# Patient Record
Sex: Male | Born: 1980 | Race: White | Hispanic: No | Marital: Single | Smoking: Former smoker
Health system: Southern US, Community
[De-identification: ages and names within clinical notes are randomized; demographics above are authoritative.]

## PROBLEM LIST (undated history)

## (undated) DIAGNOSIS — Z8619 Personal history of other infectious and parasitic diseases: Secondary | ICD-10-CM

## (undated) DIAGNOSIS — F419 Anxiety disorder, unspecified: Secondary | ICD-10-CM

## (undated) DIAGNOSIS — K219 Gastro-esophageal reflux disease without esophagitis: Secondary | ICD-10-CM

## (undated) HISTORY — PX: CLOSED REDUCTION RADIAL HEAD / NECK FRACTURE: SUR238

## (undated) HISTORY — PX: TONSILLECTOMY: SUR1361

## (undated) HISTORY — PX: WISDOM TOOTH EXTRACTION: SHX21

## (undated) HISTORY — DX: Anxiety disorder, unspecified: F41.9

## (undated) HISTORY — DX: Gastro-esophageal reflux disease without esophagitis: K21.9

## (undated) HISTORY — DX: Personal history of other infectious and parasitic diseases: Z86.19

---
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
## 2004-01-28 ENCOUNTER — Ambulatory Visit: Payer: Self-pay | Admitting: Internal Medicine

## 2004-03-12 ENCOUNTER — Ambulatory Visit: Payer: Self-pay | Admitting: Internal Medicine

## 2004-06-18 ENCOUNTER — Ambulatory Visit: Payer: Self-pay | Admitting: Internal Medicine

## 2004-09-05 ENCOUNTER — Ambulatory Visit: Payer: Self-pay | Admitting: Internal Medicine

## 2004-11-18 ENCOUNTER — Ambulatory Visit: Payer: Self-pay | Admitting: Internal Medicine

## 2005-04-02 ENCOUNTER — Ambulatory Visit: Payer: Self-pay | Admitting: Internal Medicine

## 2005-09-22 ENCOUNTER — Ambulatory Visit: Payer: Self-pay | Admitting: Internal Medicine

## 2006-02-09 HISTORY — PX: OPEN REDUCTION INTERNAL FIXATION (ORIF) TIBIA/FIBULA FRACTURE: SHX5992

## 2006-04-06 ENCOUNTER — Ambulatory Visit (HOSPITAL_BASED_OUTPATIENT_CLINIC_OR_DEPARTMENT_OTHER): Admission: RE | Admit: 2006-04-06 | Discharge: 2006-04-06 | Payer: Self-pay | Admitting: Internal Medicine

## 2006-04-15 ENCOUNTER — Ambulatory Visit: Payer: Self-pay | Admitting: Pulmonary Disease

## 2006-05-11 ENCOUNTER — Ambulatory Visit: Payer: Self-pay | Admitting: Internal Medicine

## 2006-05-11 LAB — CONVERTED CEMR LAB
Calcium: 9.3 mg/dL (ref 8.4–10.5)
Ferritin: 104.9 ng/mL (ref 22.0–322.0)
Folate: 11.7 ng/mL
Glucose, Bld: 82 mg/dL (ref 70–99)
Magnesium: 2.2 mg/dL (ref 1.5–2.5)
Vitamin B-12: 487 pg/mL (ref 211–911)

## 2006-06-30 ENCOUNTER — Encounter: Payer: Self-pay | Admitting: Internal Medicine

## 2006-06-30 ENCOUNTER — Ambulatory Visit: Payer: Self-pay | Admitting: Internal Medicine

## 2006-07-23 ENCOUNTER — Ambulatory Visit: Payer: Self-pay | Admitting: Internal Medicine

## 2006-12-08 ENCOUNTER — Telehealth (INDEPENDENT_AMBULATORY_CARE_PROVIDER_SITE_OTHER): Payer: Self-pay | Admitting: *Deleted

## 2006-12-09 ENCOUNTER — Ambulatory Visit: Payer: Self-pay | Admitting: Internal Medicine

## 2006-12-09 DIAGNOSIS — F411 Generalized anxiety disorder: Secondary | ICD-10-CM | POA: Insufficient documentation

## 2007-01-24 ENCOUNTER — Encounter: Payer: Self-pay | Admitting: Internal Medicine

## 2007-01-28 ENCOUNTER — Ambulatory Visit: Payer: Self-pay | Admitting: Internal Medicine

## 2007-05-06 ENCOUNTER — Telehealth (INDEPENDENT_AMBULATORY_CARE_PROVIDER_SITE_OTHER): Payer: Self-pay | Admitting: *Deleted

## 2007-07-20 ENCOUNTER — Telehealth (INDEPENDENT_AMBULATORY_CARE_PROVIDER_SITE_OTHER): Payer: Self-pay | Admitting: *Deleted

## 2007-11-18 ENCOUNTER — Encounter (INDEPENDENT_AMBULATORY_CARE_PROVIDER_SITE_OTHER): Payer: Self-pay | Admitting: *Deleted

## 2008-07-16 ENCOUNTER — Ambulatory Visit: Payer: Self-pay | Admitting: Internal Medicine

## 2008-07-16 DIAGNOSIS — K625 Hemorrhage of anus and rectum: Secondary | ICD-10-CM | POA: Insufficient documentation

## 2008-07-16 DIAGNOSIS — R0989 Other specified symptoms and signs involving the circulatory and respiratory systems: Secondary | ICD-10-CM

## 2008-07-16 DIAGNOSIS — I479 Paroxysmal tachycardia, unspecified: Secondary | ICD-10-CM

## 2008-07-16 DIAGNOSIS — R0609 Other forms of dyspnea: Secondary | ICD-10-CM

## 2008-07-16 DIAGNOSIS — K219 Gastro-esophageal reflux disease without esophagitis: Secondary | ICD-10-CM | POA: Insufficient documentation

## 2008-08-27 ENCOUNTER — Telehealth: Payer: Self-pay | Admitting: Internal Medicine

## 2009-08-13 ENCOUNTER — Ambulatory Visit: Payer: Self-pay | Admitting: Internal Medicine

## 2009-08-13 DIAGNOSIS — M79609 Pain in unspecified limb: Secondary | ICD-10-CM

## 2009-10-30 ENCOUNTER — Encounter: Payer: Self-pay | Admitting: Internal Medicine

## 2010-03-09 LAB — CONVERTED CEMR LAB
Basophils Relative: 0.2 % (ref 0.0–3.0)
Chloride: 103 meq/L (ref 96–112)
Creatinine, Ser: 0.8 mg/dL (ref 0.4–1.5)
Eosinophils Relative: 0.5 % (ref 0.0–5.0)
Free T4: 0.7 ng/dL (ref 0.6–1.6)
Hemoglobin: 15.6 g/dL (ref 13.0–17.0)
Lymphocytes Relative: 31.6 % (ref 12.0–46.0)
MCV: 95.3 fL (ref 78.0–100.0)
Magnesium: 2.3 mg/dL (ref 1.5–2.5)
Monocytes Absolute: 0.5 10*3/uL (ref 0.1–1.0)
Neutro Abs: 3.9 10*3/uL (ref 1.4–7.7)
Neutrophils Relative %: 59.8 % (ref 43.0–77.0)
Potassium: 3.9 meq/L (ref 3.5–5.1)
RBC: 4.66 M/uL (ref 4.22–5.81)
Sodium: 140 meq/L (ref 135–145)
WBC: 6.4 10*3/uL (ref 4.5–10.5)

## 2010-03-13 NOTE — Assessment & Plan Note (Signed)
Summary: left wrist pain//kn-Rm 2   Vital Signs:  Patient profile:   30 year old male Height:      65.75 inches Weight:      167.25 pounds BMI:     27.30 Temp:     98.7 degrees F oral Pulse rate:   60 / minute Pulse rhythm:   regular Resp:     12 per minute BP sitting:   128 / 70  (right arm) Cuff size:   regular  Vitals Entered By: Mervin Kung CMA Duncan Dull) (August 13, 2009 1:42 PM) CC: Room 2   Pt states he has left wrist pain x 2 weeks., Lower Extremity Joint pain Is Patient Diabetic? No Comments Pt states he does not need Klonopin any longer and hasn't taken x 6 months.   CC:  Room 2   Pt states he has left wrist pain x 2 weeks. and Lower Extremity Joint pain.  History of Present Illness:  Upper  Extremity Joint Pain      This is a 30 year old man who presents with Upper  Extremity Joint pain X 2 weeks @ ulnar aspect  wrist.  The patient reports weakness, but denies swelling, redness, giving away, locking, popping, stiffness for >1 hr, and decreased ROM.  The pain is located in the left wrist.  The pain began gradually and with no injury.  The pain is described as aching and constant.  There has been no evaluation to date   The patient denies the following symptoms: fever, rash, photosensitivity, eye symptoms, diarrhea, and dysuria.  Rx: NSAIDS with minimal , temporary benefit. A brace helps.Pain worse with palmar weight bearing.  Allergies: 1)  ! Pcn 2)  ! * Grass 3)  ! * Pollen  Review of Systems General:  Denies chills, sweats, and weight loss. Eyes:  Denies discharge, eye pain, and red eye. GU:  Denies discharge and hematuria. Derm:  Denies lesion(s) and rash.  Physical Exam  Cervical Nodes:  No lymphadenopathy noted Axillary Nodes:  No palpable lymphadenopathy   Wrist/Hand Exam  General:    Well-developed, well-nourished, in no acute distress; alert and oriented x 3.    Skin:    Intact with no erythema; no scarring.    Inspection:    Inspection is  normal.    Palpation:    Non-tender to palpation   Vascular:    Radial, ulnar pulses 2+ and symmetric; no evidence of ischemia, clubbing, or cyanosis.    Wrist Exam:    Left:    Inspection:  Normal    Palpation:  Normal    Stability:  stable    Tenderness:  no    Swelling:  no    Erythema:  no    Full ROM   Impression & Recommendations:  Problem # 1:  HAND PAIN, LEFT (ICD-729.5)  Orders: Misc. Referral (Misc. Ref)  Complete Medication List: 1)  Prilosec 20 Mg Cpdr (Omeprazole) .... Prn 2)  Propecia 1 Mg Tabs (Finasteride) .Marland Kitchen.. 1 by mouth qd 3)  Multivitamin  .... Liquid vitamin double strength qd 4)  Klonopin 0.5 Mg Tabs (Clonazepam) .Marland Kitchen.. 1 at bedtime prn 5)  Sertraline Hcl 50 Mg Tabs (Sertraline hcl) .Marland Kitchen.. 1 once daily  Patient Instructions: 1)  Use brace. Glucosamine Sulfate 1500 mg once daily until seen by Hand Surgeon  Current Allergies (reviewed today): ! PCN ! * GRASS ! * POLLEN

## 2010-03-13 NOTE — Medication Information (Signed)
Summary: Nonadherence with Sertraline/[facility]  Nonadherence with Sertraline/[facility]   Imported By: Lanelle Bal 11/07/2009 10:06:53  _____________________________________________________________________  External Attachment:    Type:   Image     Comment:   External Document

## 2010-06-27 NOTE — Assessment & Plan Note (Signed)
[facility]                        [place] OFFICE NOTE   CORRADO, HYMON                         MRN:          [id number]  DATE:05/11/2006                            DOB:          1980/11/05    Brett Wright was seen May 11, 2006 to review a sleep study performed  April 06, 2006 which suggested small numbers of obstructive events,  which did not meet apnea/hypopnea index criteria for obstructive sleep  apnea.  Moderate numbers of leg jerks were noted with at least some  degree of sleep disruption.   He states he has had minor restless leg syndrome symptoms in the past,  but not consistently.  His main symptoms consist of tightness in his  quadriceps and his knees, and aching in the legs after he exercises.  He  exercises at a high cardiovascular rate or level, such as running 3  miles in 30 minutes 3 to 4 times a week.  He denies any claudication  symptoms.   He feels his problem is more of a sleep dysfunction as he will lie  sleepless in bed for hours.  He is practicing sleep hygiene, avoiding  stimulants and alcohol at night.  He notices, when he does drink on the  weekends, that his sleep pattern is altered adversely.   Deep tendon reflexes are normal and pulses are intact.  Homan sign is  negative.  Skin is normal in temperature and color over the lower  extremities.   He will continue sleep hygiene maneuvers as he is presently doing.   To evaluate the possibility of restless leg syndrome, B12, folate,  calcium, magnesium, glucose, BUN, creatinine, potassium, and magnesium  levels, as well as ferritin will be drawn.  Additionally a CPK & vit  D(25 OH) will be added because of the aching he has in his thighs.  Clonazepam 0.5 mg at bedtime as needed will be provided as a trial, due  to the description he gives of this sleep dysfunction.     Titus Dubin. Alwyn Ren, MD,FACP,FCCP  Electronically Signed    [facility]  DD: 05/11/2006   DT: 05/11/2006  Job #: [id number]

## 2010-06-27 NOTE — Procedures (Signed)
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   EPWORTH SLEEPINESS SCORE:  6   MEDICATIONS:   SLEEP ARCHITECTURE:  The patient had a total sleep time of 299 minutes  with very decreased slow wave sleep and REM.  Sleep onset latency was  prolonged at 67 minutes and REM onset prolonged at 270 minutes.  Sleep  efficiency was very poor at 62%.   RESPIRATORY DATA:  The patient was found to have two obstructive apneas  for an apnea hypopnea index less than one per hour.  Obviously, he did  not meet split night protocol secondary to the very small numbers of  events.  There was mild intermittent snoring noted.   OXYGEN DATA:  His O2 saturation was as low as 91%.   CARDIAC DATA:  No clinically significant arrhythmias were noted.   MOVEMENT-PARASOMNIA:  The patient was found to have 78 leg jerks with  3.6 per hour resulting in arousal or awakening.  This may be clinically  significant.   IMPRESSIONS-RECOMMENDATIONS:  1. Small numbers of obstructive events which do not meet the apnea-      hypopnea index criteria for the obstructive sleep apnea syndrome.  2. Moderate numbers of leg jerks with at least some degree of sleep      disruption.  Clinical correlation is suggested      to see if the patient may have a history consistent with the      restless leg syndrome or the periodic leg movement syndrome.      Barbaraann Share, MD,FCCP  Diplomate, American Board of Sleep  Medicine  Electronically Signed     [facility]  D:  04/22/2006 17:40:56  T:  04/24/2006 09:24:19  Job:   [id number]

## 2010-12-10 ENCOUNTER — Encounter: Payer: Self-pay | Admitting: Internal Medicine

## 2010-12-10 ENCOUNTER — Ambulatory Visit (INDEPENDENT_AMBULATORY_CARE_PROVIDER_SITE_OTHER): Payer: BC Managed Care – PPO | Admitting: Internal Medicine

## 2010-12-10 VITALS — BP 124/84 | HR 75 | Temp 98.9°F | Resp 14 | Wt 164.4 lb

## 2010-12-10 DIAGNOSIS — R21 Rash and other nonspecific skin eruption: Secondary | ICD-10-CM

## 2010-12-10 DIAGNOSIS — L309 Dermatitis, unspecified: Secondary | ICD-10-CM

## 2010-12-10 MED ORDER — FAMCICLOVIR 500 MG PO TABS: 500.0000 mg | ORAL_TABLET | Freq: Three times a day (TID) | ORAL | Status: AC

## 2010-12-10 NOTE — Progress Notes (Signed)
  Subjective:    Patient ID: Brett Wright, male    DOB: October 17, 1980, 30 y.o.   MRN: [id number]  HPI RASH: Location: R inguinal area  Onset: 1 week ago but similar rash in 2006 which resolved w/o Rx . Initially there were multiple papules in the right lower quadrant/inguinal area.  Course: The papules resolved but 2  residual lesions with scabbing remain Self-treated with: cleansing, J&J baby powder              Improvement with treatment: keeps it dry History Pruritis: no  Tenderness: no   Tick/insect/pet exposure: no  Recent travel: no  New detergent, new clothing, or other topical exposure: no   Red Flags Feeling ill: no  Fever: no  Mouth lesions: no  Facial/tongue swelling/difficulty breathing:  no,      Review of Systems he denies dysuria, hematuria, or pyuria.  He has been under increased stress recently.     Objective:   Physical Exam  He is healthy appearing in no acute distress.  He has no lymphadenopathy in the neck, axilla, or inguinal areas. He has no organomegaly or masses.  There are 2 areas of eschar with mild surrounding erythema which are nontender and the right inguinal area. The lesion measures 6 x 5 mm each  Genital exam is unremarkable; there are no penile lesions        Assessment & Plan:  #1 a herpetic infection, most likely zoster is suspect. It is possible this represents a variant of herpes simplex.  Plan: See orders and recommendations.

## 2010-12-10 NOTE — Patient Instructions (Signed)
If immune compromise suggested take  vitamin C 2000 mg daily; & Echinacea for 7-10 days. Report fever, exudate("pus") or progressive pain.

## 2011-04-08 ENCOUNTER — Other Ambulatory Visit: Payer: Self-pay | Admitting: Internal Medicine

## 2011-04-08 NOTE — Telephone Encounter (Signed)
Prescription sent

## 2011-10-14 ENCOUNTER — Telehealth: Payer: Self-pay | Admitting: Internal Medicine

## 2011-10-14 MED ORDER — SERTRALINE HCL 50 MG PO TABS: ORAL_TABLET | ORAL | Status: DC

## 2011-10-14 NOTE — Telephone Encounter (Signed)
RX sent in, patient due for CPX 11/2011 or after

## 2011-10-14 NOTE — Telephone Encounter (Signed)
Refill: Zoloft 50mg . Take 1 tablet daily. Qty 30. Last fill 06-15-11

## 2012-09-08 ENCOUNTER — Ambulatory Visit (INDEPENDENT_AMBULATORY_CARE_PROVIDER_SITE_OTHER): Payer: BC Managed Care – PPO | Admitting: Internal Medicine

## 2012-09-08 ENCOUNTER — Encounter: Payer: Self-pay | Admitting: Internal Medicine

## 2012-09-08 VITALS — BP 122/80 | HR 62 | Temp 98.2°F | Wt 161.6 lb

## 2012-09-08 DIAGNOSIS — F411 Generalized anxiety disorder: Secondary | ICD-10-CM

## 2012-09-08 MED ORDER — SERTRALINE HCL 50 MG PO TABS: ORAL_TABLET | ORAL | Status: DC

## 2012-09-08 NOTE — Progress Notes (Signed)
  Subjective:    Patient ID: Brett Wright, male    DOB: Aug 21, 1980, 32 y.o.   MRN: [id number]  HPI   He had taken generic Zoloft for approximately 2.5 years; he discontinued it at least 6 months ago to see if it were of benefit.  He does describe symptoms of serotonin deficiency such as anxiety, irritability, and difficulty in difficulty managing  multiple tasks. This affects sleep. There is some anhedonia.  His work is very stressful with multiple demands. He now has a 48-year-old.  He denies a constellation of headache, flushing, chest pain, and diarrhea.    Review of Systems  Constitutional: No significant change in weight,  fatigue, weakness Cardiovascular: No palpitations, racing, irregularity, sweating Gastrointestinal: No nausea or vomiting,constipation, diarrhea Neuro: No tremor, mental status change,headache  Psych: No  depression,  panic attacks,alcohhol or drug abuse Endocrine: No change in skin/hair/nails       Objective:   Physical Exam Appears healthy and well-nourished & in no acute distress  No carotid bruits are present. Thyroid normal to palpation  Heart rhythm and rate are normal with no significant murmurs or gallops.  Chest is clear with no increased work of breathing  There is no evidence of aortic aneurysm or renal artery bruits  Abdomen soft with no organomegaly or masses. No HJR  No clubbing, cyanosis or edema present. No onycholysis  Pedal pulses are intact   No ischemic skin changes are present . Nails healthy    Alert and oriented. Strength, tone, DTRs reflexes normal. No tremor          Assessment & Plan:  See Current Assessment & Plan in Problem List under specific Diagnosis

## 2012-09-08 NOTE — Assessment & Plan Note (Signed)
TSH, free T4 Resume Sertraline

## 2012-09-08 NOTE — Addendum Note (Signed)
Addended byPecola Lawless on: 09/08/2012 08:52 AM   Modules accepted: Orders

## 2012-09-08 NOTE — Patient Instructions (Addendum)

## 2015-02-15 ENCOUNTER — Encounter: Payer: Self-pay | Admitting: Physician Assistant

## 2015-02-15 ENCOUNTER — Ambulatory Visit (INDEPENDENT_AMBULATORY_CARE_PROVIDER_SITE_OTHER): Payer: BLUE CROSS/BLUE SHIELD | Admitting: Physician Assistant

## 2015-02-15 VITALS — BP 118/86 | HR 84 | Temp 98.1°F | Resp 16 | Ht 66.0 in | Wt 172.2 lb

## 2015-02-15 DIAGNOSIS — Z23 Encounter for immunization: Secondary | ICD-10-CM

## 2015-02-15 DIAGNOSIS — Z0000 Encounter for general adult medical examination without abnormal findings: Secondary | ICD-10-CM | POA: Diagnosis not present

## 2015-02-15 NOTE — Progress Notes (Signed)
Pre visit review using our clinic review tool, if applicable. No additional management support is needed unless otherwise documented below in the visit note/SLS  

## 2015-02-15 NOTE — Progress Notes (Signed)
Patient presents to clinic today to transfer of care from Dr. Alwyn Ren. Patient is requesting a checkup today. Denies acute concerns. Endorses 20 pound weight gain the past 2 years and is curious about cholesterol. Body mass index is 27.82 kg/(m^2). Patient endorses diet well-balanced. Does endorse sedentary lifestyle due to his job.  Has restarted a daily workout regimen and already noticed weight loss. Patient takes a multivitamin daily.  Past Medical History  Diagnosis Date  . Anxiety     no current issue -- many years ago  . GERD (gastroesophageal reflux disease)   . History of chicken pox     Current Outpatient Prescriptions on File Prior to Visit  Medication Sig Dispense Refill  . clonazePAM (KLONOPIN) 0.5 MG tablet Take 0.5 mg by mouth at bedtime as needed.      . multivitamin (THERAGRAN) per tablet Take 1 tablet by mouth daily.       No current facility-administered medications on file prior to visit.    Allergies  Allergen Reactions  . Pollen Extract     Allergic rhinoconjunctivitis  . Penicillins      ? Reaction as child    Family History  Problem Relation Age of Onset  . Hypertension Father     Lving  . Anxiety disorder Father     stress  . Thyroid disease Mother     Living  . Heart attack Maternal Grandmother     in 51s  . Thyroid disease Maternal Grandmother   . Lung cancer Paternal Uncle     SMOKER  . Diabetes Neg Hx   . Stroke Neg Hx   . Heart disease Other     Paternal Side  . Heart disease Father   . Healthy Son     x2    Social History   Social History  . Marital Status: Single    Spouse Name: N/A  . Number of Children: N/A  . Years of Education: N/A   Occupational History  . financial advisor    Social History Main Topics  . Smoking status: Former Smoker    Quit date: 02/10/1999  . Smokeless tobacco: Never Used     Comment: smoked 1996-2001 , 1 pp MONTH  . Alcohol Use: 0.0 oz/week    0 Standard drinks or equivalent per week   Comment: SOCIALLY  . Drug Use: No  . Sexual Activity:    Partners: Female     Comment: wife   Other Topics Concern  . None   Social History Narrative   Regular Exercise-3x wkly         Review of Systems  Constitutional: Negative for fever and weight loss.  HENT: Negative for ear discharge, ear pain, hearing loss and tinnitus.   Eyes: Negative for blurred vision, double vision, photophobia and pain.  Respiratory: Negative for cough and shortness of breath.   Cardiovascular: Negative for chest pain and palpitations.  Gastrointestinal: Negative for heartburn, nausea, vomiting, abdominal pain, diarrhea, constipation, blood in stool and melena.  Genitourinary: Negative for dysuria, urgency, frequency, hematuria and flank pain.  Musculoskeletal: Negative for falls.  Neurological: Negative for dizziness, loss of consciousness and headaches.  Endo/Heme/Allergies: Negative for environmental allergies.  Psychiatric/Behavioral: Negative for depression, suicidal ideas, hallucinations and substance abuse. The patient is not nervous/anxious and does not have insomnia.    BP 118/86 mmHg  Pulse 84  Temp(Src) 98.1 F (36.7 C) (Oral)  Resp 16  Ht 5\' 6"  (1.676 m)  Wt 172 lb 4  oz (78.132 kg)  BMI 27.82 kg/m2  SpO2 98%  Physical Exam  Constitutional: He is oriented to person, place, and time and well-developed, well-nourished, and in no distress.  HENT:  Head: Normocephalic and atraumatic.  Right Ear: External ear normal.  Left Ear: External ear normal.  Nose: Nose normal.  Mouth/Throat: Oropharynx is clear and moist. No oropharyngeal exudate.  Eyes: Conjunctivae and EOM are normal. Pupils are equal, round, and reactive to light.  Neck: Neck supple. No thyromegaly present.  Cardiovascular: Normal rate, regular rhythm, normal heart sounds and intact distal pulses.   Pulmonary/Chest: Effort normal and breath sounds normal. No respiratory distress. He has no wheezes. He has no rales. He  exhibits no tenderness.  Abdominal: Soft. Bowel sounds are normal. He exhibits no distension and no mass. There is no tenderness. There is no rebound and no guarding.  Genitourinary: Testes/scrotum normal.  Not examined today  Lymphadenopathy:    He has no cervical adenopathy.  Neurological: He is alert and oriented to person, place, and time.  Skin: Skin is warm and dry. No rash noted.  Psychiatric: Affect normal.  Vitals reviewed.   Assessment/Plan: Visit for preventive health examination Depression screen negative. Health Maintenance reviewed -- Declines flu shot. TDaP given today. Preventive schedule discussed and handout given in AVS. Will obtain fasting labs today.

## 2015-02-15 NOTE — Patient Instructions (Signed)
Please go to the lab for blood work.  I will call you with your results. If your blood work is normal we will follow-up yearly for physicals.  If anything is abnormal we will treat you and get you in for a follow-up.  Please continue your diet and exercise regimen to promote healthy weight.  Do not hesitate to call or come see [place] in office for any acute concerns or questions you may have.  Preventive Care for Adults, Male A healthy lifestyle and preventive care can promote health and wellness. Preventive health guidelines for men include the following key practices:  A routine yearly physical is a good way to check with your health care provider about your health and preventative screening. It is a chance to share any concerns and updates on your health and to receive a thorough exam.  Visit your dentist for a routine exam and preventative care every 6 months. Brush your teeth twice a day and floss once a day. Good oral hygiene prevents tooth decay and gum disease.  The frequency of eye exams is based on your age, health, family medical history, use of contact lenses, and other factors. Follow your health care provider's recommendations for frequency of eye exams.  Eat a healthy diet. Foods such as vegetables, fruits, whole grains, low-fat dairy products, and lean protein foods contain the nutrients you need without too many calories. Decrease your intake of foods high in solid fats, added sugars, and salt. Eat the right amount of calories for you.Get information about a proper diet from your health care provider, if necessary.  Regular physical exercise is one of the most important things you can do for your health. Most adults should get at least 150 minutes of moderate-intensity exercise (any activity that increases your heart rate and causes you to sweat) each week. In addition, most adults need muscle-strengthening exercises on 2 or more days a week.  Maintain a healthy weight. The body  mass index (BMI) is a screening tool to identify possible weight problems. It provides an estimate of body fat based on height and weight. Your health care provider can find your BMI and can help you achieve or maintain a healthy weight.For adults 20 years and older:  A BMI below 18.5 is considered underweight.  A BMI of 18.5 to 24.9 is normal.  A BMI of 25 to 29.9 is considered overweight.  A BMI of 30 and above is considered obese.  Maintain normal blood lipids and cholesterol levels by exercising and minimizing your intake of saturated fat. Eat a balanced diet with plenty of fruit and vegetables. Blood tests for lipids and cholesterol should begin at age 52 and be repeated every 5 years. If your lipid or cholesterol levels are high, you are over 50, or you are at high risk for heart disease, you may need your cholesterol levels checked more frequently.Ongoing high lipid and cholesterol levels should be treated with medicines if diet and exercise are not working.  If you smoke, find out from your health care provider how to quit. If you do not use tobacco, do not start.  Lung cancer screening is recommended for adults aged 76-80 years who are at high risk for developing lung cancer because of a history of smoking. A yearly low-dose CT scan of the lungs is recommended for people who have at least a 30-pack-year history of smoking and are a current smoker or have quit within the past 15 years. A pack year of smoking  is smoking an average of 1 pack of cigarettes a day for 1 year (for example: 1 pack a day for 30 years or 2 packs a day for 15 years). Yearly screening should continue until the smoker has stopped smoking for at least 15 years. Yearly screening should be stopped for people who develop a health problem that would prevent them from having lung cancer treatment.  If you choose to drink alcohol, do not have more than 2 drinks per day. One drink is considered to be 12 ounces (355 mL) of  beer, 5 ounces (148 mL) of wine, or 1.5 ounces (44 mL) of liquor.  Avoid use of street drugs. Do not share needles with anyone. Ask for help if you need support or instructions about stopping the use of drugs.  High blood pressure causes heart disease and increases the risk of stroke. Your blood pressure should be checked at least every 1-2 years. Ongoing high blood pressure should be treated with medicines, if weight loss and exercise are not effective.  If you are 97-41 years old, ask your health care provider if you should take aspirin to prevent heart disease.  Diabetes screening is done by taking a blood sample to check your blood glucose level after you have not eaten for a certain period of time (fasting). If you are not overweight and you do not have risk factors for diabetes, you should be screened once every 3 years starting at age 85. If you are overweight or obese and you are 17-43 years of age, you should be screened for diabetes every year as part of your cardiovascular risk assessment.  Colorectal cancer can be detected and often prevented. Most routine colorectal cancer screening begins at the age of 81 and continues through age 56. However, your health care provider may recommend screening at an earlier age if you have risk factors for colon cancer. On a yearly basis, your health care provider may provide home test kits to check for hidden blood in the stool. Use of a small camera at the end of a tube to directly examine the colon (sigmoidoscopy or colonoscopy) can detect the earliest forms of colorectal cancer. Talk to your health care provider about this at age 33, when routine screening begins. Direct exam of the colon should be repeated every 5-10 years through age 58, unless early forms of precancerous polyps or small growths are found.  People who are at an increased risk for hepatitis B should be screened for this virus. You are considered at high risk for hepatitis B if:  You  were born in a country where hepatitis B occurs often. Talk with your health care provider about which countries are considered high risk.  Your parents were born in a high-risk country and you have not received a shot to protect against hepatitis B (hepatitis B vaccine).  You have HIV or AIDS.  You use needles to inject street drugs.  You live with, or have sex with, someone who has hepatitis B.  You are a man who has sex with other men (MSM).  You get hemodialysis treatment.  You take certain medicines for conditions such as cancer, organ transplantation, and autoimmune conditions.  Hepatitis C blood testing is recommended for all people born from [birth year] through 1965 and any individual with known risks for hepatitis C.  Practice safe sex. Use condoms and avoid high-risk sexual practices to reduce the spread of sexually transmitted infections (STIs). STIs include gonorrhea, chlamydia, syphilis, trichomonas, herpes,  HPV, and human immunodeficiency virus (HIV). Herpes, HIV, and HPV are viral illnesses that have no cure. They can result in disability, cancer, and death.  If you are a man who has sex with other men, you should be screened at least once per year for:  HIV.  Urethral, rectal, and pharyngeal infection of gonorrhea, chlamydia, or both.  If you are at risk of being infected with HIV, it is recommended that you take a prescription medicine daily to prevent HIV infection. This is called preexposure prophylaxis (PrEP). You are considered at risk if:  You are a man who has sex with other men (MSM) and have other risk factors.  You are a heterosexual man, are sexually active, and are at increased risk for HIV infection.  You take drugs by injection.  You are sexually active with a partner who has HIV.  Talk with your health care provider about whether you are at high risk of being infected with HIV. If you choose to begin PrEP, you should first be tested for HIV. You should then  be tested every 3 months for as long as you are taking PrEP.  A one-time screening for abdominal aortic aneurysm (AAA) and surgical repair of large AAAs by ultrasound are recommended for men ages 69 to 51 years who are current or former smokers.  Healthy men should no longer receive prostate-specific antigen (PSA) blood tests as part of routine cancer screening. Talk with your health care provider about prostate cancer screening.  Testicular cancer screening is not recommended for adult males who have no symptoms. Screening includes self-exam, a health care provider exam, and other screening tests. Consult with your health care provider about any symptoms you have or any concerns you have about testicular cancer.  Use sunscreen. Apply sunscreen liberally and repeatedly throughout the day. You should seek shade when your shadow is shorter than you. Protect yourself by wearing long sleeves, pants, a wide-brimmed hat, and sunglasses year round, whenever you are outdoors.  Once a month, do a whole-body skin exam, using a mirror to look at the skin on your back. Tell your health care provider about new moles, moles that have irregular borders, moles that are larger than a pencil eraser, or moles that have changed in shape or color.  Stay current with required vaccines (immunizations).  Influenza vaccine. All adults should be immunized every year.  Tetanus, diphtheria, and acellular pertussis (Td, Tdap) vaccine. An adult who has not previously received Tdap or who does not know his vaccine status should receive 1 dose of Tdap. This initial dose should be followed by tetanus and diphtheria toxoids (Td) booster doses every 10 years. Adults with an unknown or incomplete history of completing a 3-dose immunization series with Td-containing vaccines should begin or complete a primary immunization series including a Tdap dose. Adults should receive a Td booster every 10 years.  Varicella vaccine. An adult  without evidence of immunity to varicella should receive 2 doses or a second dose if he has previously received 1 dose.  Human papillomavirus (HPV) vaccine. Males aged 11-21 years who have not received the vaccine previously should receive the 3-dose series. Males aged 22-26 years may be immunized. Immunization is recommended through the age of 45 years for any male who has sex with males and did not get any or all doses earlier. Immunization is recommended for any person with an immunocompromised condition through the age of 43 years if he did not get any or all doses  earlier. During the 3-dose series, the second dose should be obtained 4-8 weeks after the first dose. The third dose should be obtained 24 weeks after the first dose and 16 weeks after the second dose.  Zoster vaccine. One dose is recommended for adults aged 50 years or older unless certain conditions are present.  Measles, mumps, and rubella (MMR) vaccine. Adults born before [birth year] generally are considered immune to measles and mumps. Adults born in [birth year] or later should have 1 or more doses of MMR vaccine unless there is a contraindication to the vaccine or there is laboratory evidence of immunity to each of the three diseases. A routine second dose of MMR vaccine should be obtained at least 28 days after the first dose for students attending postsecondary schools, health care workers, or international travelers. People who received inactivated measles vaccine or an unknown type of measles vaccine during 1963-1967 should receive 2 doses of MMR vaccine. People who received inactivated mumps vaccine or an unknown type of mumps vaccine before 1979 and are at high risk for mumps infection should consider immunization with 2 doses of MMR vaccine. Unvaccinated health care workers born before [birth year] who lack laboratory evidence of measles, mumps, or rubella immunity or laboratory confirmation of disease should consider measles and mumps immunization with  2 doses of MMR vaccine or rubella immunization with 1 dose of MMR vaccine.  Pneumococcal 13-valent conjugate (PCV13) vaccine. When indicated, a person who is uncertain of his immunization history and has no record of immunization should receive the PCV13 vaccine. All adults 70 years of age and older should receive this vaccine. An adult aged 71 years or older who has certain medical conditions and has not been previously immunized should receive 1 dose of PCV13 vaccine. This PCV13 should be followed with a dose of pneumococcal polysaccharide (PPSV23) vaccine. Adults who are at high risk for pneumococcal disease should obtain the PPSV23 vaccine at least 8 weeks after the dose of PCV13 vaccine. Adults older than 37 years of age who have normal immune system function should obtain the PPSV23 vaccine dose at least 1 year after the dose of PCV13 vaccine.  Pneumococcal polysaccharide (PPSV23) vaccine. When PCV13 is also indicated, PCV13 should be obtained first. All adults aged 82 years and older should be immunized. An adult younger than age 29 years who has certain medical conditions should be immunized. Any person who resides in a nursing home or long-term care facility should be immunized. An adult smoker should be immunized. People with an immunocompromised condition and certain other conditions should receive both PCV13 and PPSV23 vaccines. People with human immunodeficiency virus (HIV) infection should be immunized as soon as possible after diagnosis. Immunization during chemotherapy or radiation therapy should be avoided. Routine use of PPSV23 vaccine is not recommended for American Indians, [place] Natives, or people younger than 65 years unless there are medical conditions that require PPSV23 vaccine. When indicated, people who have unknown immunization and have no record of immunization should receive PPSV23 vaccine. One-time revaccination 5 years after the first dose of PPSV23 is recommended for people aged  19-64 years who have chronic kidney failure, nephrotic syndrome, asplenia, or immunocompromised conditions. People who received 1-2 doses of PPSV23 before age 63 years should receive another dose of PPSV23 vaccine at age 11 years or later if at least 5 years have passed since the previous dose. Doses of PPSV23 are not needed for people immunized with PPSV23 at or after age 59 years.  Meningococcal vaccine. Adults  with asplenia or persistent complement component deficiencies should receive 2 doses of quadrivalent meningococcal conjugate (MenACWY-D) vaccine. The doses should be obtained at least 2 months apart. Microbiologists working with certain meningococcal bacteria, [place] recruits, people at risk during an outbreak, and people who travel to or live in countries with a high rate of meningitis should be immunized. A first-year college student up through age 68 years who is living in a residence hall should receive a dose if he did not receive a dose on or after his 16th birthday. Adults who have certain high-risk conditions should receive one or more doses of vaccine.  Hepatitis A vaccine. Adults who wish to be protected from this disease, have chronic liver disease, work with hepatitis A-infected animals, work in hepatitis A research labs, or travel to or work in countries with a high rate of hepatitis A should be immunized. Adults who were previously unvaccinated and who anticipate close contact with an international adoptee during the first 60 days after arrival in the [place] from a country with a high rate of hepatitis A should be immunized.  Hepatitis B vaccine. Adults should be immunized if they wish to be protected from this disease, are under age 62 years and have diabetes, have chronic liver disease, have had more than one sex partner in the past 6 months, may be exposed to blood or other infectious body fluids, are household contacts or sex partners of hepatitis B positive people, are  clients or workers in certain care facilities, or travel to or work in countries with a high rate of hepatitis B.  Haemophilus influenzae type b (Hib) vaccine. A previously unvaccinated person with asplenia or sickle cell disease or having a scheduled splenectomy should receive 1 dose of Hib vaccine. Regardless of previous immunization, a recipient of a hematopoietic stem cell transplant should receive a 3-dose series 6-12 months after his successful transplant. Hib vaccine is not recommended for adults with HIV infection. Preventive Service / Frequency Ages 43 to 41  Blood pressure check.** / Every 3-5 years.  Lipid and cholesterol check.** / Every 5 years beginning at age 45.  Hepatitis C blood test.** / For any individual with known risks for hepatitis C.  Skin self-exam. / Monthly.  Influenza vaccine. / Every year.  Tetanus, diphtheria, and acellular pertussis (Tdap, Td) vaccine.** / Consult your health care provider. 1 dose of Td every 10 years.  Varicella vaccine.** / Consult your health care provider.  HPV vaccine. / 3 doses over 6 months, if 65 or younger.  Measles, mumps, rubella (MMR) vaccine.** / You need at least 1 dose of MMR if you were born in 1957 or later. You may also need a second dose.  Pneumococcal 13-valent conjugate (PCV13) vaccine.** / Consult your health care provider.  Pneumococcal polysaccharide (PPSV23) vaccine.** / 1 to 2 doses if you smoke cigarettes or if you have certain conditions.  Meningococcal vaccine.** / 1 dose if you are age 18 to 63 years and a [employer] researcher living in a residence hall, or have one of several medical conditions. You may also need additional booster doses.  Hepatitis A vaccine.** / Consult your health care provider.  Hepatitis B vaccine.** / Consult your health care provider.  Haemophilus influenzae type b (Hib) vaccine.** / Consult your health care provider. Ages 36 to 4  Blood pressure check.** / Every  year.  Lipid and cholesterol check.** / Every 5 years beginning at age 35.  Lung cancer screening. / Every year  if you are aged 54-80 years and have a 30-pack-year history of smoking and currently smoke or have quit within the past 15 years. Yearly screening is stopped once you have quit smoking for at least 15 years or develop a health problem that would prevent you from having lung cancer treatment.  Fecal occult blood test (FOBT) of stool. / Every year beginning at age 62 and continuing until age 18. You may not have to do this test if you get a colonoscopy every 10 years.  Flexible sigmoidoscopy** or colonoscopy.** / Every 5 years for a flexible sigmoidoscopy or every 10 years for a colonoscopy beginning at age 53 and continuing until age 53.  Hepatitis C blood test.** / For all people born from [birth year] through 1967 and any individual with known risks for hepatitis C.  Skin self-exam. / Monthly.  Influenza vaccine. / Every year.  Tetanus, diphtheria, and acellular pertussis (Tdap/Td) vaccine.** / Consult your health care provider. 1 dose of Td every 10 years.  Varicella vaccine.** / Consult your health care provider.  Zoster vaccine.** / 1 dose for adults aged 48 years or older.  Measles, mumps, rubella (MMR) vaccine.** / You need at least 1 dose of MMR if you were born in 1959 or later. You may also need a second dose.  Pneumococcal 13-valent conjugate (PCV13) vaccine.** / Consult your health care provider.  Pneumococcal polysaccharide (PPSV23) vaccine.** / 1 to 2 doses if you smoke cigarettes or if you have certain conditions.  Meningococcal vaccine.** / Consult your health care provider.  Hepatitis A vaccine.** / Consult your health care provider.  Hepatitis B vaccine.** / Consult your health care provider.  Haemophilus influenzae type b (Hib) vaccine.** / Consult your health care provider. Ages 45 and over  Blood pressure check.** / Every year.  Lipid and cholesterol  check.**/ Every 5 years beginning at age 29.  Lung cancer screening. / Every year if you are aged 62-80 years and have a 30-pack-year history of smoking and currently smoke or have quit within the past 15 years. Yearly screening is stopped once you have quit smoking for at least 15 years or develop a health problem that would prevent you from having lung cancer treatment.  Fecal occult blood test (FOBT) of stool. / Every year beginning at age 47 and continuing until age 35. You may not have to do this test if you get a colonoscopy every 10 years.  Flexible sigmoidoscopy** or colonoscopy.** / Every 5 years for a flexible sigmoidoscopy or every 10 years for a colonoscopy beginning at age 55 and continuing until age 40.  Hepatitis C blood test.** / For all people born from [birth year] through 1967 and any individual with known risks for hepatitis C.  Abdominal aortic aneurysm (AAA) screening.** / A one-time screening for ages 13 to 5 years who are current or former smokers.  Skin self-exam. / Monthly.  Influenza vaccine. / Every year.  Tetanus, diphtheria, and acellular pertussis (Tdap/Td) vaccine.** / 1 dose of Td every 10 years.  Varicella vaccine.** / Consult your health care provider.  Zoster vaccine.** / 1 dose for adults aged 9 years or older.  Pneumococcal 13-valent conjugate (PCV13) vaccine.** / 1 dose for all adults aged 35 years and older.  Pneumococcal polysaccharide (PPSV23) vaccine.** / 1 dose for all adults aged 40 years and older.  Meningococcal vaccine.** / Consult your health care provider.  Hepatitis A vaccine.** / Consult your health care provider.  Hepatitis B vaccine.** / Consult your health  care provider.  Haemophilus influenzae type b (Hib) vaccine.** / Consult your health care provider. **Family history and personal history of risk and conditions may change your health care provider's recommendations.   This information is not intended to replace advice given to you  by your health care provider. Make sure you discuss any questions you have with your health care provider.   Document Released: 03/24/2001 Document Revised: 02/16/2014 Document Reviewed: 06/23/2010 Elsevier Interactive Patient Education [employer].

## 2015-02-15 NOTE — Addendum Note (Signed)
Addended by: Regis BillSCATES, SHARON L on: 02/15/2015 11:00 AM   Modules accepted: Orders

## 2015-02-15 NOTE — Assessment & Plan Note (Signed)
Depression screen negative. Health Maintenance reviewed -- Declines flu shot. TDaP given today. Preventive schedule discussed and handout given in AVS. Will obtain fasting labs today.

## 2015-02-19 ENCOUNTER — Telehealth: Payer: Self-pay

## 2015-02-19 ENCOUNTER — Other Ambulatory Visit: Payer: BLUE CROSS/BLUE SHIELD

## 2015-02-19 NOTE — Telephone Encounter (Signed)
Call patient to reschedule lab appointment from 02/19/15

## 2015-03-18 ENCOUNTER — Other Ambulatory Visit (INDEPENDENT_AMBULATORY_CARE_PROVIDER_SITE_OTHER): Payer: BLUE CROSS/BLUE SHIELD

## 2015-03-18 DIAGNOSIS — Z0000 Encounter for general adult medical examination without abnormal findings: Secondary | ICD-10-CM | POA: Diagnosis not present

## 2015-03-18 LAB — LIPID PANEL
CHOLESTEROL: 257 mg/dL — AB (ref 0–200)
HDL: 45.1 mg/dL (ref >39.00)
LDL CALC: 185 mg/dL — AB (ref 0–99)
NonHDL: 211.81
TRIGLYCERIDES: 133 mg/dL (ref 0.0–149.0)
Total CHOL/HDL Ratio: 6
VLDL: 26.6 mg/dL (ref 0.0–40.0)

## 2015-03-18 LAB — COMPREHENSIVE METABOLIC PANEL
ALBUMIN: 4.5 g/dL (ref 3.5–5.2)
ALK PHOS: 44 U/L (ref 39–117)
ALT: 18 U/L (ref 0–53)
AST: 18 U/L (ref 0–37)
BUN: 15 mg/dL (ref 6–23)
CHLORIDE: 103 meq/L (ref 96–112)
CO2: 30 mEq/L (ref 19–32)
Calcium: 9.4 mg/dL (ref 8.4–10.5)
Creatinine, Ser: 0.87 mg/dL (ref 0.40–1.50)
GFR: 104.92 mL/min (ref >60.00)
Glucose, Bld: 96 mg/dL (ref 70–99)
POTASSIUM: 4.1 meq/L (ref 3.5–5.1)
SODIUM: 139 meq/L (ref 135–145)
TOTAL PROTEIN: 7.7 g/dL (ref 6.0–8.3)
Total Bilirubin: 0.4 mg/dL (ref 0.2–1.2)

## 2015-03-18 LAB — CBC
HEMATOCRIT: 42.3 % (ref 39.0–52.0)
HEMOGLOBIN: 14.3 g/dL (ref 13.0–17.0)
MCHC: 33.8 g/dL (ref 30.0–36.0)
MCV: 94 fl (ref 78.0–100.0)
Platelets: 204 10*3/uL (ref 150.0–400.0)
RBC: 4.5 Mil/uL (ref 4.22–5.81)
RDW: 12.6 % (ref 11.5–15.5)
WBC: 5.9 10*3/uL (ref 4.0–10.5)

## 2015-03-18 LAB — URINALYSIS, ROUTINE W REFLEX MICROSCOPIC
BILIRUBIN URINE: NEGATIVE
Hgb urine dipstick: NEGATIVE
KETONES UR: NEGATIVE
LEUKOCYTES UA: NEGATIVE
NITRITE: NEGATIVE
RBC / HPF: NONE SEEN (ref 0–?)
Specific Gravity, Urine: 1.025 (ref 1.000–1.030)
Total Protein, Urine: NEGATIVE
UROBILINOGEN UA: 0.2 (ref 0.0–1.0)
Urine Glucose: NEGATIVE
WBC, UA: NONE SEEN (ref 0–?)
pH: 6 (ref 5.0–8.0)

## 2015-03-18 LAB — TSH: TSH: 1.21 u[IU]/mL (ref 0.35–4.50)

## 2015-03-19 ENCOUNTER — Telehealth: Payer: Self-pay | Admitting: *Deleted

## 2015-03-19 NOTE — Telephone Encounter (Signed)
Spoke with the pt and informed him of recent lab results and note.  Pt verbalized understanding.  Pt stated that this is the first time his cholesterol has been up,  so he would like to work on his diet and exercise first before starting medications.  Pt was scheduled a follow-up appt for (Friday-05/17/15 @ 9:15am).//AB/CMA

## 2015-03-19 NOTE — Telephone Encounter (Signed)
-----   Message from Waldon Merl, PA-C sent at 03/18/2015  2:20 PM EST ----- Lab results are in. Labs look great overall but cholesterol is high with Total Cholesterol at 257 (Goal < 200) and LDL at 185 (Goal < 100). Giving how elevated his LDL is I would recommend starting a medication for cholesterol in addition to increasing his exercise to 150 minutes per week. Ok to send in Rx Atorvastatin 20 mg daily. Quantity 30 with 1 refill. Follow-up in 2 months for reassessment.

## 2015-03-20 NOTE — Telephone Encounter (Signed)
Noted  

## 2015-05-17 ENCOUNTER — Encounter: Payer: Self-pay | Admitting: Physician Assistant

## 2015-05-17 ENCOUNTER — Ambulatory Visit (INDEPENDENT_AMBULATORY_CARE_PROVIDER_SITE_OTHER): Payer: BLUE CROSS/BLUE SHIELD | Admitting: Physician Assistant

## 2015-05-17 VITALS — BP 120/58 | HR 62 | Temp 98.3°F | Ht 66.0 in | Wt 167.0 lb

## 2015-05-17 DIAGNOSIS — E785 Hyperlipidemia, unspecified: Secondary | ICD-10-CM | POA: Diagnosis not present

## 2015-05-17 LAB — LIPID PANEL
CHOLESTEROL: 240 mg/dL — AB (ref 0–200)
HDL: 50.2 mg/dL (ref >39.00)
LDL Cholesterol: 157 mg/dL — ABNORMAL HIGH (ref 0–99)
NonHDL: 189.32
Total CHOL/HDL Ratio: 5
Triglycerides: 163 mg/dL — ABNORMAL HIGH (ref 0.0–149.0)
VLDL: 32.6 mg/dL (ref 0.0–40.0)

## 2015-05-17 NOTE — Patient Instructions (Signed)
Please go to the lab for blood work. I will call with results.  I commend you on the dietary and exercise changes you have made. We will see what an impact this has had on your lipids.   Cholesterol Cholesterol is a fat. Your body needs a small amount of cholesterol. Cholesterol may build up in your blood vessels. This increases your chance of having a heart attack or stroke. You cannot feel your cholesterol levels. The only way to know your cholesterol level is high is with a blood test. Keep your test results. Work with your doctor to keep your cholesterol at a good level. WHAT DO THE TEST RESULTS MEAN?  Total cholesterol is how much cholesterol is in your blood.  LDL is bad cholesterol. This is the type that can build up. You want LDL to be low.  HDL is good cholesterol. It cleans your blood vessels and carries LDL away. You want HDL to be high.  Triglycerides are fat that the body can burn for energy or store. WHAT ARE GOOD LEVELS OF CHOLESTEROL?  Total cholesterol below 200.  LDL below 100 for people at risk. Below 70 for those at very high risk.  HDL above 50 is good. Above 60 is best.  Triglycerides below 150. HOW CAN I LOWER MY CHOLESTEROL?  Diet. Follow your diet programs as told by your doctor.  Choose fish, white meat chicken, roasted [place], or baked [place]key. Try not to eat red meat, fried foods, or processed meats such as sausage and lunch meats.  Eat lots of fresh fruits and vegetables.  Choose whole grains, beans, pasta, potatoes, and cereals.  Use only small amounts of olive, corn, or canola oils.  Try not to eat butter, mayonnaise, shortening, or palm kernel oils.  Try not to eat foods with trans fats.  Drink skim or nonfat milk. Eat low-fat or nonfat yogurt and cheeses. Try not to drink whole milk or cream. Try not to eat ice cream, egg yolks, and full-fat cheeses.  Healthy desserts include angel food cake, ginger snaps, animal crackers, hard candy,  popsicles, and low-fat or nonfat frozen yogurt. Try not to eat pastries, cakes, pies, and cookies.  Exercise. Follow your exercise programs as told by your doctor.  Be more active. You can try gardening, walking, or taking the stairs. Ask your doctor about how you can be more active.  Medicine. Take medicine as told by your doctor.   This information is not intended to replace advice given to you by your health care provider. Make sure you discuss any questions you have with your health care provider.   Document Released: 04/24/2008 Document Revised: 02/16/2014 Document Reviewed: 11/09/2012 Elsevier Interactive Patient Education [employer]2016 Elsevier Inc.

## 2015-05-17 NOTE — Progress Notes (Signed)
Pre visit review using our clinic review tool, if applicable. No additional management support is needed unless otherwise documented below in the visit note. 

## 2015-05-17 NOTE — Assessment & Plan Note (Signed)
Has made significant changes with diet and exercise with weight loss. Will repeat lipid profile today.

## 2015-05-17 NOTE — Progress Notes (Signed)
Patient presents to clinic today for follow-up of hyperlipidemia. LDL noted to be elevated in 180s. Patient had decided to work on diet and exercise. Patient has reduced saturated fats in diet and red meats. Is exercising daily and has lost 10 pounds per patient.   Past Medical History  Diagnosis Date  . Anxiety     no current issue -- many years ago  . GERD (gastroesophageal reflux disease)   . History of chicken pox     Current Outpatient Prescriptions on File Prior to Visit  Medication Sig Dispense Refill  . clonazePAM (KLONOPIN) 0.5 MG tablet Take 0.5 mg by mouth at bedtime as needed.      . multivitamin (THERAGRAN) per tablet Take 1 tablet by mouth daily.       No current facility-administered medications on file prior to visit.    Allergies  Allergen Reactions  . Pollen Extract     Allergic rhinoconjunctivitis  . Penicillins      ? Reaction as child    Family History  Problem Relation Age of Onset  . Hypertension Father     Lving  . Anxiety disorder Father     stress  . Thyroid disease Mother     Living  . Heart attack Maternal Grandmother     in 30s  . Thyroid disease Maternal Grandmother   . Lung cancer Paternal Uncle     SMOKER  . Diabetes Neg Hx   . Stroke Neg Hx   . Heart disease Other     Paternal Side  . Heart disease Father   . Healthy Son     x2    Social History   Social History  . Marital Status: Single    Spouse Name: N/A  . Number of Children: N/A  . Years of Education: N/A   Occupational History  . financial advisor    Social History Main Topics  . Smoking status: Former Smoker    Quit date: 02/10/1999  . Smokeless tobacco: Never Used     Comment: smoked 1996-2001 , 1 pp MONTH  . Alcohol Use: 0.0 oz/week    0 Standard drinks or equivalent per week     Comment: SOCIALLY  . Drug Use: No  . Sexual Activity:    Partners: Female     Comment: wife   Other Topics Concern  . None   Social History Narrative   Regular  Exercise-3x wkly         Review of Systems - See HPI.  All other ROS are negative.  BP 120/58 mmHg  Pulse 62  Temp(Src) 98.3 F (36.8 C) (Oral)  Ht _0  (1.676 m)  Wt 167 lb (75.751 kg)  BMI 26.97 kg/m2  SpO2 97%  Physical Exam  Constitutional: He is well-developed, well-nourished, and in no distress.  HENT:  Head: Normocephalic and atraumatic.  Cardiovascular: Normal rate, regular rhythm, normal heart sounds and intact distal pulses.   Pulmonary/Chest: Effort normal.  Skin: Skin is warm and dry. No rash noted.  Psychiatric: Affect normal.  Vitals reviewed.   Recent Results (from the past 2160 hour(s))  CBC     Status: None   Collection Time: 03/18/15  8:43 AM  Result Value Ref Range   WBC 5.9 4.0 - 10.5 K/uL   RBC 4.50 4.22 - 5.81 Mil/uL   Platelets 204.0 150.0 - 400.0 K/uL   Hemoglobin 14.3 13.0 - 17.0 g/dL   HCT 42.3 39.0 - 52.0 %   MCV  94.0 78.0 - 100.0 fl   MCHC 33.8 30.0 - 36.0 g/dL   RDW 12.6 11.5 - 15.5 %  Comp Met (CMET)     Status: None   Collection Time: 03/18/15  8:43 AM  Result Value Ref Range   Sodium 139 135 - 145 mEq/L   Potassium 4.1 3.5 - 5.1 mEq/L   Chloride 103 96 - 112 mEq/L   CO2 30 19 - 32 mEq/L   Glucose, Bld 96 70 - 99 mg/dL   BUN 15 6 - 23 mg/dL   Creatinine, Ser 0.87 0.40 - 1.50 mg/dL   Total Bilirubin 0.4 0.2 - 1.2 mg/dL   Alkaline Phosphatase 44 39 - 117 U/L   AST 18 0 - 37 U/L   ALT 18 0 - 53 U/L   Total Protein 7.7 6.0 - 8.3 g/dL   Albumin 4.5 3.5 - 5.2 g/dL   Calcium 9.4 8.4 - 10.5 mg/dL   GFR 104.92 >60.00 mL/min  TSH     Status: None   Collection Time: 03/18/15  8:43 AM  Result Value Ref Range   TSH 1.21 0.35 - 4.50 uIU/mL  Urinalysis, Routine w reflex microscopic     Status: None   Collection Time: 03/18/15  8:43 AM  Result Value Ref Range   Color, Urine YELLOW Yellow;Lt. Yellow   APPearance CLEAR Clear   Specific Gravity, Urine 1.025 1.000-1.030   pH 6.0 5.0 - 8.0   Total Protein, Urine NEGATIVE Negative   Urine  Glucose NEGATIVE Negative   Ketones, ur NEGATIVE Negative   Bilirubin Urine NEGATIVE Negative   Hgb urine dipstick NEGATIVE Negative   Urobilinogen, UA 0.2 0.0 - 1.0   Leukocytes, UA NEGATIVE Negative   Nitrite NEGATIVE Negative   WBC, UA none seen 0-2/hpf   RBC / HPF none seen 0-2/hpf  Lipid Profile     Status: Abnormal   Collection Time: 03/18/15  8:43 AM  Result Value Ref Range   Cholesterol 257 (H) 0 - 200 mg/dL    Comment: ATP III Classification       Desirable:  < 200 mg/dL               Borderline High:  200 - 239 mg/dL          High:  > = 240 mg/dL   Triglycerides 133.0 0.0 - 149.0 mg/dL    Comment: Normal:  <150 mg/dLBorderline High:  150 - 199 mg/dL   HDL 45.10 >39.00 mg/dL   VLDL 26.6 0.0 - 40.0 mg/dL   LDL Cholesterol 185 (H) 0 - 99 mg/dL   Total CHOL/HDL Ratio 6     Comment:                Men          Women1/2 Average Risk     3.4          3.3Average Risk          5.0          4.42X Average Risk          9.6          7.13X Average Risk          15.0          11.0                       NonHDL 211.81     Comment: NOTE:  Non-HDL goal should be 30  mg/dL higher than patient's LDL goal (i.e. LDL goal of < 70 mg/dL, would have non-HDL goal of < 100 mg/dL)    Assessment/Plan: Hyperlipidemia Has made significant changes with diet and exercise with weight loss. Will repeat lipid profile today.

## 2015-05-20 ENCOUNTER — Encounter: Payer: Self-pay | Admitting: *Deleted

## 2015-09-03 ENCOUNTER — Encounter: Payer: Self-pay | Admitting: Physician Assistant

## 2015-09-03 ENCOUNTER — Ambulatory Visit (INDEPENDENT_AMBULATORY_CARE_PROVIDER_SITE_OTHER): Payer: BLUE CROSS/BLUE SHIELD | Admitting: Physician Assistant

## 2015-09-03 VITALS — BP 128/88 | HR 69 | Temp 98.3°F | Resp 16 | Ht 66.0 in | Wt 170.5 lb

## 2015-09-03 DIAGNOSIS — F418 Other specified anxiety disorders: Secondary | ICD-10-CM | POA: Diagnosis not present

## 2015-09-03 DIAGNOSIS — F419 Anxiety disorder, unspecified: Principal | ICD-10-CM

## 2015-09-03 DIAGNOSIS — F32A Depression, unspecified: Secondary | ICD-10-CM

## 2015-09-03 DIAGNOSIS — F329 Major depressive disorder, single episode, unspecified: Secondary | ICD-10-CM | POA: Insufficient documentation

## 2015-09-03 MED ORDER — SERTRALINE HCL 25 MG PO TABS: 25.0000 mg | ORAL_TABLET | Freq: Every day | ORAL | 1 refills | Status: DC

## 2015-09-03 MED ORDER — CLONAZEPAM 0.5 MG PO TABS: 0.5000 mg | ORAL_TABLET | Freq: Every evening | ORAL | 0 refills | Status: DC | PRN

## 2015-09-03 NOTE — Patient Instructions (Signed)
Please start the Zoloft as directed, once daily. Keep up with exercise as it is a great stress outlet.  Keep the Xanax on hand for panic attacks. I am hopeful you will not have to use.  Follow-up with me in 4-6 weeks.  If you note any worsening symptoms after starting medication, stop the medicine and call or come see me immediately.

## 2015-09-03 NOTE — Progress Notes (Signed)
Patient presents to clinic today c/o increased anxiety x 6 months. Patient notes added stressors with work, family and home. Feels overworked. . Endorses significant generalized anxiety, feeling moody and on-edge. Endorses having a couple of panic attacks at home in the past month with acute anxiety, chest tightness. Denies episodes of chest pain or SOB. Denies depressed mood but notes anhedonia. Patient endorses being traditionally an extrovert but feels more introvert. SI/HI. Has history of anxiety requiring pharmacotherapy. Endorses previously being on Zoloft for about 3 years, prescribed by previous PCP. Endorses doing well on this regimen and was able to successfully wean off of medication and control anxiety on his own for quite some time.    Past Medical History:  Diagnosis Date  . Anxiety    no current issue -- many years ago  . GERD (gastroesophageal reflux disease)   . History of chicken pox     Current Outpatient Prescriptions on File Prior to Visit  Medication Sig Dispense Refill  . multivitamin (THERAGRAN) per tablet Take 1 tablet by mouth daily.      . clonazePAM (KLONOPIN) 0.5 MG tablet Take 0.5 mg by mouth at bedtime as needed (TMJ).      No current facility-administered medications on file prior to visit.     Allergies  Allergen Reactions  . Pollen Extract     Allergic rhinoconjunctivitis  . Penicillins      ? Reaction as child    Family History  Problem Relation Age of Onset  . Hypertension Father     Lving  . Anxiety disorder Father     stress  . Thyroid disease Mother     Living  . Heart attack Maternal Grandmother     in 12s  . Thyroid disease Maternal Grandmother   . Lung cancer Paternal Uncle     SMOKER  . Diabetes Neg Hx   . Stroke Neg Hx   . Heart disease Other     Paternal Side  . Heart disease Father   . Healthy Son     x2    Social History   Social History  . Marital status: Single    Spouse name: N/A  . Number of children: N/A  .  Years of education: N/A   Occupational History  . financial advisor    Social History Main Topics  . Smoking status: Former Smoker    Quit date: 02/10/1999  . Smokeless tobacco: Never Used     Comment: smoked 1996-2001 , 1 pp MONTH  . Alcohol use 0.0 oz/week     Comment: SOCIALLY  . Drug use: No  . Sexual activity: Yes    Partners: Female     Comment: wife   Other Topics Concern  . None   Social History Narrative   Regular Exercise-3x wkly         Review of Systems - See HPI.  All other ROS are negative.  BP 128/88 (BP Location: Right Arm, Patient Position: Sitting, Cuff Size: Normal)   Pulse 69   Temp 98.3 F (36.8 C) (Oral)   Resp 16   Ht  (1.676 m)   Wt 170 lb 8 oz (77.3 kg)   SpO2 98%   BMI 27.52 kg/m   Physical Exam  Constitutional: He is oriented to person, place, and time and well-developed, well-nourished, and in no distress.  HENT:  Head: Normocephalic and atraumatic.  Eyes: Conjunctivae are normal.  Neck: Neck supple. No thyromegaly present.  Cardiovascular: Normal  rate, regular rhythm, normal heart sounds and intact distal pulses.   Pulmonary/Chest: Effort normal and breath sounds normal.  Neurological: He is alert and oriented to person, place, and time.  Skin: Skin is warm and dry. No rash noted.  Psychiatric: Affect normal.  Vitals reviewed.  Assessment/Plan: 1. Anxiety and depression Will restart Zoloft 25 mg daily. Take as directed. Rx Xanax temporary for acute anxiety. Only to be used as directed PRN. Handout on counseling services given to patient. FU scheduled 1 month.     Piedad Climes, PA-C

## 2015-10-04 ENCOUNTER — Ambulatory Visit: Payer: BLUE CROSS/BLUE SHIELD | Admitting: Physician Assistant

## 2015-11-21 ENCOUNTER — Other Ambulatory Visit: Payer: Self-pay | Admitting: Physician Assistant

## 2015-11-22 NOTE — Telephone Encounter (Signed)
Medication filled to pharmacy as requested. Pt notified by scheduler, Angelique Blonderenise, that a follow-up appointment is needed for further refills.

## 2015-11-22 NOTE — Telephone Encounter (Signed)
Caller name: Relationship to patient: Self  Can be reached: [phone]   Reason for call: Patient called stating that he is leaving town in about an hour and needs enough Zoloft to last him until he gets backs

## 2016-01-14 ENCOUNTER — Telehealth: Payer: Self-pay | Admitting: Physician Assistant

## 2016-01-14 NOTE — Telephone Encounter (Signed)
Caller name: Relationship to patient: Self Can be reached: [phone]  Pharmacy:  [id number] - [place], [place] - [address] [phone]) [phone] (Fax)     Reason for call: Refill sertraline (ZOLOFT) 25 MG tablet [[id number]][[id number]]

## 2016-01-15 NOTE — Telephone Encounter (Signed)
Advised patient he would need an appointment for more refills. He is agreeable. Scheduled appt for 01/17/16.

## 2016-01-15 NOTE — Telephone Encounter (Signed)
Needs appointment

## 2016-01-15 NOTE — Telephone Encounter (Signed)
Zoloft started on 09/03/15 and f/u in 1 month but patient no showed appointment. Please advise of refill

## 2016-01-17 ENCOUNTER — Ambulatory Visit (INDEPENDENT_AMBULATORY_CARE_PROVIDER_SITE_OTHER): Payer: BLUE CROSS/BLUE SHIELD | Admitting: Physician Assistant

## 2016-01-17 ENCOUNTER — Encounter: Payer: Self-pay | Admitting: Physician Assistant

## 2016-01-17 VITALS — BP 128/78 | HR 67 | Temp 98.6°F | Resp 14 | Ht 66.0 in | Wt 170.0 lb

## 2016-01-17 DIAGNOSIS — F32A Depression, unspecified: Secondary | ICD-10-CM

## 2016-01-17 DIAGNOSIS — F329 Major depressive disorder, single episode, unspecified: Secondary | ICD-10-CM

## 2016-01-17 DIAGNOSIS — F418 Other specified anxiety disorders: Secondary | ICD-10-CM

## 2016-01-17 DIAGNOSIS — Z23 Encounter for immunization: Secondary | ICD-10-CM | POA: Diagnosis not present

## 2016-01-17 DIAGNOSIS — F419 Anxiety disorder, unspecified: Principal | ICD-10-CM

## 2016-01-17 MED ORDER — SERTRALINE HCL 25 MG PO TABS: ORAL_TABLET | ORAL | 1 refills | Status: DC

## 2016-01-17 NOTE — Patient Instructions (Signed)
I am glad you are doing so well. Please continue medications as directed. Follow-up with me in 6 months for a complete physical. Return sooner if needed.  Happy Holidays!

## 2016-01-17 NOTE — Progress Notes (Signed)
Pre visit review using our clinic review tool, if applicable. No additional management support is needed unless otherwise documented below in the visit note. 

## 2016-01-17 NOTE — Progress Notes (Signed)
Patient presents to clinic today for follow-up of anxiety. Patient is currently on Sertraline 25 mg daily. Is taking as directed. Denies side effects of medication. Endorses decreased anxiety levels. Denies depressed mood or anhedonia. Denies SI/HI.  Agrees to flu shot today. Denies acute concerns at today's visit.   Past Medical History:  Diagnosis Date  . Anxiety    no current issue -- many years ago  . GERD (gastroesophageal reflux disease)   . History of chicken pox     Current Outpatient Prescriptions on File Prior to Visit  Medication Sig Dispense Refill  . clonazePAM (KLONOPIN) 0.5 MG tablet Take 1 tablet (0.5 mg total) by mouth at bedtime as needed (TMJ). 10 tablet 0  . finasteride (PROPECIA) 1 MG tablet Take 0.5 mg by mouth. Take 0.5 tablet every 3 days for alopecia    . multivitamin (THERAGRAN) per tablet Take 1 tablet by mouth daily.      . sertraline (ZOLOFT) 25 MG tablet TAKE 1 TABLET(25 MG) BY MOUTH DAILY 30 tablet 0   No current facility-administered medications on file prior to visit.     Allergies  Allergen Reactions  . Pollen Extract     Allergic rhinoconjunctivitis  . Penicillins      ? Reaction as child    Family History  Problem Relation Age of Onset  . Hypertension Father     Lving  . Anxiety disorder Father     stress  . Heart disease Father   . Thyroid disease Mother     Living  . Heart attack Maternal Grandmother     in 1560s  . Thyroid disease Maternal Grandmother   . Lung cancer Paternal Uncle     SMOKER  . Heart disease Other     Paternal Side  . Healthy Son     x2  . Diabetes Neg Hx   . Stroke Neg Hx     Social History   Social History  . Marital status: Single    Spouse name: N/A  . Number of children: N/A  . Years of education: N/A   Occupational History  . financial advisor    Social History Main Topics  . Smoking status: Former Smoker    Quit date: 02/10/1999  . Smokeless tobacco: Never Used     Comment: smoked  1996-2001 , 1 pp MONTH  . Alcohol use 0.0 oz/week     Comment: SOCIALLY  . Drug use: No  . Sexual activity: Yes    Partners: Female     Comment: wife   Other Topics Concern  . None   Social History Narrative   Regular Exercise-3x wkly         Review of Systems - See HPI.  All other ROS are negative.  BP 128/78   Pulse 67   Temp 98.6 F (37 C) (Oral)   Resp 14   Ht 5\' 6"  (1.676 m)   Wt 170 lb (77.1 kg)   SpO2 98%   BMI 27.44 kg/m   Physical Exam  Constitutional: He is oriented to person, place, and time and well-developed, well-nourished, and in no distress.  HENT:  Head: Normocephalic and atraumatic.  Eyes: Conjunctivae are normal.  Neck: Neck supple.  Cardiovascular: Normal rate, regular rhythm, normal heart sounds and intact distal pulses.   Pulmonary/Chest: Effort normal and breath sounds normal. No respiratory distress. He has no wheezes. He has no rales. He exhibits no tenderness.  Neurological: He is alert and oriented  to person, place, and time.  Skin: Skin is warm and dry. No rash noted.  Psychiatric: Affect normal.  Vitals reviewed.  Assessment/Plan: 1. Anxiety and depression Doing very well on current regimen. No alarm signs/symptoms. Will continue Zoloft as directed. FU 6 months. Return sooner if needed.  Flu shot given today.     Piedad ClimesMartin, Freddrick Gladson Cody, PA-C

## 2016-01-20 ENCOUNTER — Ambulatory Visit: Payer: BLUE CROSS/BLUE SHIELD | Admitting: Physician Assistant

## 2017-05-12 ENCOUNTER — Telehealth: Payer: Self-pay | Admitting: Physician Assistant

## 2017-05-12 NOTE — Telephone Encounter (Signed)
Copied from CRM [phone]. Topic: Quick Communication - Rx Refill/Question >> May 12, 2017  3:44 PM Diana EvesHoyt, Maryann B wrote: Medication: zoloft   Pt has appt set up for 05/14/17 but is out of the medication and hoping to get a couple of days worth sent in. He would also like to be notified either way if this able to be done.   Preferred Pharmacy (with phone number or street name): [facility] [phone] - [place], [place] - [address] Agent: Please be advised that RX refills may take up to 3 business days. We ask that you follow-up with your pharmacy.

## 2017-05-12 NOTE — Telephone Encounter (Signed)
We have not called medication in since December 2017.  Patient would have gotten enough medicine to last 6 months (June 2018).   Pharmacy states that patient last picked up RX on Jun 27, 2016.  Pt states "he had extra medicine  and it has lasted him almost a year before he needed any refills."  Since neither our office or the pharmacy has record of extra medicine, we are unable to call in any medication until he has been seen.   I offered to schedule patient tomorrow so that he would not have to wait until Friday and he states that he cannot come in until Friday morning.  He is aware that no medication will be called in until his appointment on Friday.

## 2017-05-14 ENCOUNTER — Ambulatory Visit: Payer: Managed Care, Other (non HMO) | Admitting: Physician Assistant

## 2017-05-14 ENCOUNTER — Other Ambulatory Visit: Payer: Self-pay

## 2017-05-14 ENCOUNTER — Ambulatory Visit: Payer: Self-pay | Admitting: Physician Assistant

## 2017-05-14 ENCOUNTER — Encounter: Payer: Self-pay | Admitting: Physician Assistant

## 2017-05-14 VITALS — BP 122/80 | HR 68 | Temp 98.8°F | Resp 14 | Ht 66.0 in | Wt 165.0 lb

## 2017-05-14 DIAGNOSIS — Z0289 Encounter for other administrative examinations: Secondary | ICD-10-CM

## 2017-05-14 DIAGNOSIS — F419 Anxiety disorder, unspecified: Secondary | ICD-10-CM

## 2017-05-14 DIAGNOSIS — Z1283 Encounter for screening for malignant neoplasm of skin: Secondary | ICD-10-CM | POA: Diagnosis not present

## 2017-05-14 DIAGNOSIS — F329 Major depressive disorder, single episode, unspecified: Secondary | ICD-10-CM | POA: Diagnosis not present

## 2017-05-14 DIAGNOSIS — E785 Hyperlipidemia, unspecified: Secondary | ICD-10-CM | POA: Diagnosis not present

## 2017-05-14 DIAGNOSIS — Z0000 Encounter for general adult medical examination without abnormal findings: Secondary | ICD-10-CM | POA: Diagnosis not present

## 2017-05-14 DIAGNOSIS — F32A Depression, unspecified: Secondary | ICD-10-CM

## 2017-05-14 LAB — CBC WITH DIFFERENTIAL/PLATELET
BASOS ABS: 0 10*3/uL (ref 0.0–0.1)
Basophils Relative: 0.5 % (ref 0.0–3.0)
EOS ABS: 0 10*3/uL (ref 0.0–0.7)
Eosinophils Relative: 0.4 % (ref 0.0–5.0)
HEMATOCRIT: 43.4 % (ref 39.0–52.0)
Hemoglobin: 15.3 g/dL (ref 13.0–17.0)
LYMPHS PCT: 39.3 % (ref 12.0–46.0)
Lymphs Abs: 2.3 10*3/uL (ref 0.7–4.0)
MCHC: 35.2 g/dL (ref 30.0–36.0)
MCV: 95 fl (ref 78.0–100.0)
Monocytes Absolute: 0.5 10*3/uL (ref 0.1–1.0)
Monocytes Relative: 9.3 % (ref 3.0–12.0)
NEUTROS ABS: 2.9 10*3/uL (ref 1.4–7.7)
NEUTROS PCT: 50.5 % (ref 43.0–77.0)
PLATELETS: 220 10*3/uL (ref 150.0–400.0)
RBC: 4.56 Mil/uL (ref 4.22–5.81)
RDW: 12.5 % (ref 11.5–15.5)
WBC: 5.8 10*3/uL (ref 4.0–10.5)

## 2017-05-14 LAB — COMPREHENSIVE METABOLIC PANEL
ALT: 19 U/L (ref 0–53)
AST: 21 U/L (ref 0–37)
Albumin: 4.8 g/dL (ref 3.5–5.2)
Alkaline Phosphatase: 46 U/L (ref 39–117)
BILIRUBIN TOTAL: 0.8 mg/dL (ref 0.2–1.2)
BUN: 13 mg/dL (ref 6–23)
CALCIUM: 10.1 mg/dL (ref 8.4–10.5)
CO2: 32 meq/L (ref 19–32)
CREATININE: 1 mg/dL (ref 0.40–1.50)
Chloride: 97 mEq/L (ref 96–112)
GFR: 88.32 mL/min (ref >60.00)
GLUCOSE: 89 mg/dL (ref 70–99)
Potassium: 4.1 mEq/L (ref 3.5–5.1)
Sodium: 137 mEq/L (ref 135–145)
Total Protein: 7.8 g/dL (ref 6.0–8.3)

## 2017-05-14 LAB — URINALYSIS, ROUTINE W REFLEX MICROSCOPIC
Bilirubin Urine: NEGATIVE
Hgb urine dipstick: NEGATIVE
KETONES UR: NEGATIVE
Leukocytes, UA: NEGATIVE
Nitrite: NEGATIVE
PH: 7 (ref 5.0–8.0)
SPECIFIC GRAVITY, URINE: 1.015 (ref 1.000–1.030)
Total Protein, Urine: NEGATIVE
URINE GLUCOSE: NEGATIVE
UROBILINOGEN UA: 0.2 (ref 0.0–1.0)

## 2017-05-14 LAB — TSH: TSH: 1.03 u[IU]/mL (ref 0.35–4.50)

## 2017-05-14 LAB — LIPID PANEL
CHOL/HDL RATIO: 5
Cholesterol: 279 mg/dL — ABNORMAL HIGH (ref 0–200)
HDL: 60.6 mg/dL (ref >39.00)
LDL Cholesterol: 194 mg/dL — ABNORMAL HIGH (ref 0–99)
NonHDL: 218.4
Triglycerides: 123 mg/dL (ref 0.0–149.0)
VLDL: 24.6 mg/dL (ref 0.0–40.0)

## 2017-05-14 MED ORDER — FLUTICASONE PROPIONATE 50 MCG/ACT NA SUSP: 2.0000 | Freq: Every day | NASAL | 5 refills | Status: DC

## 2017-05-14 MED ORDER — SERTRALINE HCL 25 MG PO TABS: ORAL_TABLET | ORAL | 1 refills | Status: DC

## 2017-05-14 NOTE — Patient Instructions (Signed)
Please go to the lab for blood work.  Our office will call you with your results unless you have chosen to receive results via MyChart. If your blood work is normal we will follow-up each year for physicals and as scheduled for chronic medical problems. If anything is abnormal we will treat accordingly and get you in for a follow-up. You will be contacted for assessment with Dermatology.   Preventive Care 18-39 Years, Male Preventive care refers to lifestyle choices and visits with your health care provider that can promote health and wellness. What does preventive care include?  A yearly physical exam. This is also called an annual well check.  Dental exams once or twice a year.  Routine eye exams. Ask your health care provider how often you should have your eyes checked.  Personal lifestyle choices, including: ? Daily care of your teeth and gums. ? Regular physical activity. ? Eating a healthy diet. ? Avoiding tobacco and drug use. ? Limiting alcohol use. ? Practicing safe sex. What happens during an annual well check? The services and screenings done by your health care provider during your annual well check will depend on your age, overall health, lifestyle risk factors, and family history of disease. Counseling Your health care provider may ask you questions about your:  Alcohol use.  Tobacco use.  Drug use.  Emotional well-being.  Home and relationship well-being.  Sexual activity.  Eating habits.  Work and work Statistician.  Screening You may have the following tests or measurements:  Height, weight, and BMI.  Blood pressure.  Lipid and cholesterol levels. These may be checked every 5 years starting at age 50.  Diabetes screening. This is done by checking your blood sugar (glucose) after you have not eaten for a while (fasting).  Skin check.  Hepatitis C blood test.  Hepatitis B blood test.  Sexually transmitted disease (STD) testing.  Discuss  your test results, treatment options, and if necessary, the need for more tests with your health care provider. Vaccines Your health care provider may recommend certain vaccines, such as:  Influenza vaccine. This is recommended every year.  Tetanus, diphtheria, and acellular pertussis (Tdap, Td) vaccine. You may need a Td booster every 10 years.  Varicella vaccine. You may need this if you have not been vaccinated.  HPV vaccine. If you are 55 or younger, you may need three doses over 6 months.  Measles, mumps, and rubella (MMR) vaccine. You may need at least one dose of MMR.You may also need a second dose.  Pneumococcal 13-valent conjugate (PCV13) vaccine. You may need this if you have certain conditions and have not been vaccinated.  Pneumococcal polysaccharide (PPSV23) vaccine. You may need one or two doses if you smoke cigarettes or if you have certain conditions.  Meningococcal vaccine. One dose is recommended if you are age 54-21 years and a first-year college student living in a residence hall, or if you have one of several medical conditions. You may also need additional booster doses.  Hepatitis A vaccine. You may need this if you have certain conditions or if you travel or work in places where you may be exposed to hepatitis A.  Hepatitis B vaccine. You may need this if you have certain conditions or if you travel or work in places where you may be exposed to hepatitis B.  Haemophilus influenzae type b (Hib) vaccine. You may need this if you have certain risk factors.  Talk to your health care provider about which screenings  and vaccines you need and how often you need them. This information is not intended to replace advice given to you by your health care provider. Make sure you discuss any questions you have with your health care provider. Document Released: 03/24/2001 Document Revised: 10/16/2015 Document Reviewed: 11/27/2014 Elsevier Interactive Patient Education  [employer].

## 2017-05-14 NOTE — Assessment & Plan Note (Signed)
Has worked hard on diet and exercise with weight loss. Recheck  lipid and LFT today.

## 2017-05-14 NOTE — Assessment & Plan Note (Signed)
Depression screen negative. Health Maintenance reviewed. Preventive schedule discussed and handout given in AVS. Will obtain fasting labs today.  

## 2017-05-14 NOTE — Assessment & Plan Note (Signed)
Stable on sertraline 25 mg daily. Will continue medication at current dose. Follow-up 6 months.

## 2017-05-14 NOTE — Progress Notes (Signed)
Patient presents to clinic today for follow-up of anxiety. Patient previously on a regimen of Sertraline 25 mg. Endorses doing well on medication when he takes. Was doing better on the medication so felt he didn't need. When he stopped he noticed major anxiety and irritability after completing a wean off of the medication. As such, he restarted and has been taking as directed.. Denies acute anxiety currently but does note increased stress and teeth grinding. Is working out Conservation officer, nature) to help with stress levels. Is sleeping well. Denies depressed mood or anhedonia.    Patient due for CPE. Is fasting for labs.    Past Medical History:  Diagnosis Date  . Anxiety    no current issue -- many years ago  . GERD (gastroesophageal reflux disease)   . History of chicken pox     Current Outpatient Medications on File Prior to Visit  Medication Sig Dispense Refill  . finasteride (PROPECIA) 1 MG tablet Take 0.5 mg by mouth. Take 0.5 tablet every 3 days for alopecia    . Melatonin 3 MG TABS Take 1 tablet by mouth at bedtime.    . multivitamin (THERAGRAN) per tablet Take 1 tablet by mouth daily.       No current facility-administered medications on file prior to visit.     Allergies  Allergen Reactions  . Pollen Extract     Allergic rhinoconjunctivitis  . Penicillins      ? Reaction as child    Family History  Problem Relation Age of Onset  . Hypertension Father        Lving  . Anxiety disorder Father        stress  . Heart disease Father   . Thyroid disease Mother        Living  . Heart attack Maternal Grandmother        in 30s  . Thyroid disease Maternal Grandmother   . Lung cancer Paternal Uncle        SMOKER  . Heart disease Other        Paternal Side  . Healthy Son        x2  . Diabetes Neg Hx   . Stroke Neg Hx     Social History   Socioeconomic History  . Marital status: Single    Spouse name: Not on file  . Number of children: Not on file  . Years of education: Not  on file  . Highest education level: Not on file  Occupational History  . Occupation: Firefighter  Social Needs  . Financial resource strain: Not on file  . Food insecurity:    Worry: Not on file    Inability: Not on file  . Transportation needs:    Medical: Not on file    Non-medical: Not on file  Tobacco Use  . Smoking status: Former Smoker    Last attempt to quit: 02/10/1999    Years since quitting: 18.2  . Smokeless tobacco: Never Used  . Tobacco comment: smoked 1996-2001 , 1 pp MONTH  Substance and Sexual Activity  . Alcohol use: Yes    Alcohol/week: 0.0 oz    Comment: SOCIALLY  . Drug use: No  . Sexual activity: Yes    Partners: Female    Comment: wife  Lifestyle  . Physical activity:    Days per week: Not on file    Minutes per session: Not on file  . Stress: Not on file  Relationships  . Social connections:  Talks on phone: Not on file    Gets together: Not on file    Attends religious service: Not on file    Active member of club or organization: Not on file    Attends meetings of clubs or organizations: Not on file    Relationship status: Not on file  Other Topics Concern  . Not on file  Social History Narrative   Regular Exercise-3x wkly      Review of Systems  Constitutional: Negative for fever and weight loss.  HENT: Negative for ear discharge, ear pain, hearing loss and tinnitus.   Eyes: Negative for blurred vision, double vision, photophobia and pain.  Respiratory: Negative for cough and shortness of breath.   Cardiovascular: Negative for chest pain and palpitations.  Gastrointestinal: Negative for abdominal pain, blood in stool, constipation, diarrhea, heartburn, melena, nausea and vomiting.  Genitourinary: Negative for dysuria, flank pain, frequency, hematuria and urgency.  Musculoskeletal: Negative for falls.  Neurological: Negative for dizziness, loss of consciousness and headaches.  Endo/Heme/Allergies: Negative for environmental  allergies.  Psychiatric/Behavioral: Negative for depression, hallucinations, substance abuse and suicidal ideas. The patient is not nervous/anxious and does not have insomnia.    BP 122/80   Pulse 68   Temp 98.8 F (37.1 C) (Oral)   Resp 14   Ht 5\' 6"  (1.676 m)   Wt 165 lb (74.8 kg)   SpO2 98%   BMI 26.63 kg/m   Physical Exam  Constitutional: He is oriented to person, place, and time and well-developed, well-nourished, and in no distress.  HENT:  Head: Normocephalic and atraumatic.  Right Ear: External ear normal.  Left Ear: External ear normal.  Nose: Nose normal.  Mouth/Throat: Oropharynx is clear and moist. No oropharyngeal exudate.  Eyes: Pupils are equal, round, and reactive to light. Conjunctivae and EOM are normal.  Neck: Neck supple. No thyromegaly present.  Cardiovascular: Normal rate, regular rhythm, normal heart sounds and intact distal pulses.  Pulmonary/Chest: Effort normal and breath sounds normal. No respiratory distress. He has no wheezes. He has no rales. He exhibits no tenderness.  Abdominal: Soft. Bowel sounds are normal. He exhibits no distension and no mass. There is no tenderness. There is no rebound and no guarding.  Genitourinary: Testes/scrotum normal and penis normal. No discharge found.  Lymphadenopathy:    He has no cervical adenopathy.  Neurological: He is alert and oriented to person, place, and time.  Skin: Skin is warm and dry. No rash noted.  Psychiatric: Affect normal.  Vitals reviewed.   Assessment/Plan: Visit for preventive health examination Depression screen negative. Health Maintenance reviewed. Preventive schedule discussed and handout given in AVS. Will obtain fasting labs today.   Hyperlipidemia Has worked hard on diet and exercise with weight loss. Recheck  lipid and LFT today.  Anxiety and depression Stable on sertraline 25 mg daily. Will continue medication at current dose. Follow-up 6 months.    Piedad ClimesWilliam Cody Axil Copeman,  PA-C

## 2017-05-17 ENCOUNTER — Other Ambulatory Visit: Payer: Self-pay

## 2017-05-17 DIAGNOSIS — E7800 Pure hypercholesterolemia, unspecified: Secondary | ICD-10-CM

## 2017-06-28 ENCOUNTER — Other Ambulatory Visit: Payer: Managed Care, Other (non HMO)

## 2017-06-29 ENCOUNTER — Other Ambulatory Visit (INDEPENDENT_AMBULATORY_CARE_PROVIDER_SITE_OTHER): Payer: Managed Care, Other (non HMO)

## 2017-06-29 DIAGNOSIS — E7800 Pure hypercholesterolemia, unspecified: Secondary | ICD-10-CM | POA: Diagnosis not present

## 2017-06-29 LAB — HEPATIC FUNCTION PANEL
ALK PHOS: 51 U/L (ref 39–117)
ALT: 18 U/L (ref 0–53)
AST: 20 U/L (ref 0–37)
Albumin: 4.6 g/dL (ref 3.5–5.2)
BILIRUBIN DIRECT: 0.1 mg/dL (ref 0.0–0.3)
Total Bilirubin: 0.8 mg/dL (ref 0.2–1.2)
Total Protein: 7.6 g/dL (ref 6.0–8.3)

## 2017-06-29 LAB — LIPID PANEL
CHOLESTEROL: 273 mg/dL — AB (ref 0–200)
HDL: 60.8 mg/dL (ref >39.00)
NonHDL: 211.72
Total CHOL/HDL Ratio: 4
Triglycerides: 226 mg/dL — ABNORMAL HIGH (ref 0.0–149.0)
VLDL: 45.2 mg/dL — ABNORMAL HIGH (ref 0.0–40.0)

## 2017-06-29 LAB — LDL CHOLESTEROL, DIRECT: Direct LDL: 176 mg/dL

## 2018-02-17 ENCOUNTER — Other Ambulatory Visit: Payer: Self-pay

## 2018-02-17 ENCOUNTER — Encounter: Payer: Self-pay | Admitting: Physician Assistant

## 2018-02-17 ENCOUNTER — Ambulatory Visit: Payer: Managed Care, Other (non HMO) | Admitting: Physician Assistant

## 2018-02-17 VITALS — BP 122/60 | HR 60 | Temp 98.3°F | Resp 14 | Ht 66.0 in | Wt 170.0 lb

## 2018-02-17 DIAGNOSIS — J069 Acute upper respiratory infection, unspecified: Secondary | ICD-10-CM | POA: Diagnosis not present

## 2018-02-17 DIAGNOSIS — H6502 Acute serous otitis media, left ear: Secondary | ICD-10-CM | POA: Diagnosis not present

## 2018-02-17 MED ORDER — METHYLPREDNISOLONE ACETATE 80 MG/ML IJ SUSP
80.0000 mg | Freq: Once | INTRAMUSCULAR | Status: AC
Start: 2018-02-17 — End: 2018-02-17
  Administered 2018-02-17: 80 mg via INTRAMUSCULAR

## 2018-02-17 NOTE — Progress Notes (Signed)
Patient presents to clinic today c/o 5 days of post-nasal drainage, sore throat and L ear fullness/popping. Denies cough or chest congestion. Denies sinus pressure or sinus pain. Denies difficulty chewing. Some odynophagia. Denies fever, chills, malaise or fatigue.Marland Kitchen   Past Medical History:  Diagnosis Date  . Anxiety    no current issue -- many years ago  . GERD (gastroesophageal reflux disease)   . History of chicken pox     Current Outpatient Medications on File Prior to Visit  Medication Sig Dispense Refill  . finasteride (PROPECIA) 1 MG tablet Take 0.5 mg by mouth. Take 0.5 tablet every 3 days for alopecia    . fluticasone (FLONASE) 50 MCG/ACT nasal spray Place 2 sprays into both nostrils daily. 16 g 5  . Melatonin 3 MG TABS Take 1 tablet by mouth at bedtime.    . multivitamin (THERAGRAN) per tablet Take 1 tablet by mouth daily.      . sertraline (ZOLOFT) 25 MG tablet TAKE 1 TABLET(25 MG) BY MOUTH DAILY 90 tablet 1   No current facility-administered medications on file prior to visit.     Allergies  Allergen Reactions  . Pollen Extract     Allergic rhinoconjunctivitis  . Penicillins      ? Reaction as child    Family History  Problem Relation Age of Onset  . Hypertension Father        Lving  . Anxiety disorder Father        stress  . Heart disease Father   . Thyroid disease Mother        Living  . Heart attack Maternal Grandmother        in 65s  . Thyroid disease Maternal Grandmother   . Lung cancer Paternal Uncle        SMOKER  . Heart disease Other        Paternal Side  . Healthy Son        x2  . Diabetes Neg Hx   . Stroke Neg Hx     Social History   Socioeconomic History  . Marital status: Single    Spouse name: Not on file  . Number of children: Not on file  . Years of education: Not on file  . Highest education level: Not on file  Occupational History  . Occupation: Firefighter  Social Needs  . Financial resource strain: Not on file  .  Food insecurity:    Worry: Not on file    Inability: Not on file  . Transportation needs:    Medical: Not on file    Non-medical: Not on file  Tobacco Use  . Smoking status: Former Smoker    Last attempt to quit: 02/10/1999    Years since quitting: 19.0  . Smokeless tobacco: Never Used  . Tobacco comment: smoked 1996-2001 , 1 pp MONTH  Substance and Sexual Activity  . Alcohol use: Yes    Alcohol/week: 0.0 standard drinks    Comment: SOCIALLY  . Drug use: No  . Sexual activity: Yes    Partners: Female    Comment: wife  Lifestyle  . Physical activity:    Days per week: Not on file    Minutes per session: Not on file  . Stress: Not on file  Relationships  . Social connections:    Talks on phone: Not on file    Gets together: Not on file    Attends religious service: Not on file    Active member  of club or organization: Not on file    Attends meetings of clubs or organizations: Not on file    Relationship status: Not on file  Other Topics Concern  . Not on file  Social History Narrative   Regular Exercise-3x wkly      Review of Systems - See HPI.  All other ROS are negative.  BP 122/60   Pulse 60   Temp 98.3 F (36.8 C) (Oral)   Resp 14   Ht 5\' 6"  (1.676 m)   Wt 170 lb (77.1 kg)   SpO2 99%   BMI 27.44 kg/m   Physical Exam Vitals signs reviewed.  Constitutional:      Appearance: He is well-developed.  HENT:     Head: Normocephalic and atraumatic.     Right Ear: No middle ear effusion.     Left Ear: A middle ear effusion (serous) is present.     Nose: No congestion.     Mouth/Throat:     Mouth: Mucous membranes are moist.     Tonsils: No tonsillar exudate or tonsillar abscesses.  Eyes:     Conjunctiva/sclera: Conjunctivae normal.  Neck:     Musculoskeletal: Neck supple.  Cardiovascular:     Rate and Rhythm: Normal rate and regular rhythm.     Heart sounds: Normal heart sounds.  Pulmonary:     Effort: Pulmonary effort is normal.     Breath sounds:  Normal breath sounds.  Lymphadenopathy:     Cervical: No cervical adenopathy.  Neurological:     Mental Status: He is alert.    Assessment/Plan: 1. Non-recurrent acute serous otitis media of left ear Continue Flonase. Start Claritin daily. Rx Depomedrol to help alleviate pressure and hopefully pop open Eustachian tube.  - methylPREDNISolone acetate (DEPO-MEDROL) injection 80 mg  2. Viral URI Supportive measures and OTC medications reviewed. Continue chronic medications as directed. Follow-up if not easing up in the next 3-4 days.   Piedad Climes, PA-C

## 2018-02-17 NOTE — Patient Instructions (Signed)
Please keep well-hydrated and get plenty of rest.  Continue Flonase and start a non-drowsy Claritin.  The steroid shot given today will help with the inflammation.   Symptoms should calm themselves down. If not, please call me.

## 2018-02-21 ENCOUNTER — Ambulatory Visit: Payer: Self-pay | Admitting: *Deleted

## 2018-02-21 DIAGNOSIS — Z20828 Contact with and (suspected) exposure to other viral communicable diseases: Secondary | ICD-10-CM

## 2018-02-21 MED ORDER — OSELTAMIVIR PHOSPHATE 75 MG PO CAPS: 75.0000 mg | ORAL_CAPSULE | Freq: Every day | ORAL | 0 refills | Status: DC

## 2018-02-21 NOTE — Telephone Encounter (Signed)
I have sent in Tamiflu once daily x 10 days for prevention. If symptoms develop will need to call me so we can change regimen.

## 2018-02-21 NOTE — Telephone Encounter (Signed)
Pt reports his son was diagnosed with Type B flu this AM. Pt states he has no symptoms presently. States his other son and wife were given Tamiflu; he is requesting RX be called into [place], [place] [address] States son's pediatrician recommended pt start med. CB # [id number]  Reason for Disposition . Caller requesting a NON-URGENT new prescription or refill and triager unable to refill per unit policy  Answer Assessment - Initial Assessment Questions 1. SYMPTOMS: "Do you have any symptoms?"     no 2. SEVERITY: If symptoms are present, ask "Are they mild, moderate or severe?"     N/A  Protocols used: MEDICATION QUESTION CALL-A-AH

## 2018-02-21 NOTE — Telephone Encounter (Signed)
Please advise 

## 2018-02-21 NOTE — Telephone Encounter (Signed)
Pt is aware of Tamiflu being called in. Pt stated verbal understanding to call back if he develops any symptoms.

## 2018-02-23 ENCOUNTER — Other Ambulatory Visit: Payer: Self-pay

## 2018-02-23 DIAGNOSIS — Z20828 Contact with and (suspected) exposure to other viral communicable diseases: Secondary | ICD-10-CM

## 2018-02-23 MED ORDER — OSELTAMIVIR PHOSPHATE 75 MG PO CAPS: 75.0000 mg | ORAL_CAPSULE | Freq: Every day | ORAL | 0 refills | Status: DC

## 2018-02-23 NOTE — Telephone Encounter (Signed)
Copied from CRM [id number]. Topic: General - Other >> Feb 23, 2018 12:22 PM Laural Benes, [place] C wrote: Reason for CRM: pt called in to follow up. Pt says that his pharmacy hasn't received Rx for Tamiflu. Pt would like to have Rx resent to the pharmacy on file.

## 2018-02-25 ENCOUNTER — Other Ambulatory Visit: Payer: Self-pay | Admitting: Physician Assistant

## 2018-02-25 DIAGNOSIS — Z20828 Contact with and (suspected) exposure to other viral communicable diseases: Secondary | ICD-10-CM

## 2018-02-25 MED ORDER — OSELTAMIVIR PHOSPHATE 75 MG PO CAPS: 75.0000 mg | ORAL_CAPSULE | Freq: Every day | ORAL | 0 refills | Status: DC

## 2018-02-25 NOTE — Addendum Note (Signed)
Addended by: Wilford CornerOVINGTON, Jhonnie Aliano W on: 02/25/2018 10:48 AM   Modules accepted: Orders

## 2018-02-25 NOTE — Telephone Encounter (Addendum)
Patient called and says that the Tamiflu was not received by the pharmacy and he's been taking his wife's and now they are out. He says his son is still on Tamiflu as well. I called and spoke to [place], [facility] who says Selena Batten just sent it to the pharmacy and they will call to verify receipt, to let the patient know to call before he goes over to pick it up. I advised the patient of the above message, he verbalized understanding.

## 2018-03-01 ENCOUNTER — Other Ambulatory Visit: Payer: Self-pay | Admitting: Physician Assistant

## 2018-03-01 DIAGNOSIS — M25519 Pain in unspecified shoulder: Secondary | ICD-10-CM

## 2019-01-14 ENCOUNTER — Other Ambulatory Visit: Payer: Self-pay | Admitting: Physician Assistant

## 2019-05-01 ENCOUNTER — Other Ambulatory Visit: Payer: Self-pay

## 2019-05-01 MED ORDER — SERTRALINE HCL 25 MG PO TABS: ORAL_TABLET | ORAL | 0 refills | Status: DC

## 2019-05-15 ENCOUNTER — Encounter: Payer: Managed Care, Other (non HMO) | Admitting: Physician Assistant

## 2019-05-25 ENCOUNTER — Other Ambulatory Visit: Payer: Self-pay

## 2019-05-25 ENCOUNTER — Encounter: Payer: Self-pay | Admitting: Physician Assistant

## 2019-05-25 ENCOUNTER — Ambulatory Visit (INDEPENDENT_AMBULATORY_CARE_PROVIDER_SITE_OTHER): Payer: Managed Care, Other (non HMO) | Admitting: Physician Assistant

## 2019-05-25 VITALS — BP 122/80 | HR 61 | Temp 98.3°F | Resp 16 | Ht 66.0 in | Wt 169.0 lb

## 2019-05-25 DIAGNOSIS — F329 Major depressive disorder, single episode, unspecified: Secondary | ICD-10-CM | POA: Diagnosis not present

## 2019-05-25 DIAGNOSIS — F419 Anxiety disorder, unspecified: Secondary | ICD-10-CM | POA: Diagnosis not present

## 2019-05-25 DIAGNOSIS — E785 Hyperlipidemia, unspecified: Secondary | ICD-10-CM

## 2019-05-25 DIAGNOSIS — Z0000 Encounter for general adult medical examination without abnormal findings: Secondary | ICD-10-CM

## 2019-05-25 DIAGNOSIS — F32A Depression, unspecified: Secondary | ICD-10-CM

## 2019-05-25 LAB — COMPREHENSIVE METABOLIC PANEL
ALT: 27 U/L (ref 0–53)
AST: 23 U/L (ref 0–37)
Albumin: 5 g/dL (ref 3.5–5.2)
Alkaline Phosphatase: 46 U/L (ref 39–117)
BUN: 11 mg/dL (ref 6–23)
CO2: 30 mEq/L (ref 19–32)
Calcium: 9.8 mg/dL (ref 8.4–10.5)
Chloride: 100 mEq/L (ref 96–112)
Creatinine, Ser: 0.83 mg/dL (ref 0.40–1.50)
GFR: 101.98 mL/min (ref >60.00)
Glucose, Bld: 90 mg/dL (ref 70–99)
Potassium: 4.7 mEq/L (ref 3.5–5.1)
Sodium: 137 mEq/L (ref 135–145)
Total Bilirubin: 0.6 mg/dL (ref 0.2–1.2)
Total Protein: 7.7 g/dL (ref 6.0–8.3)

## 2019-05-25 LAB — CBC WITH DIFFERENTIAL/PLATELET
Basophils Absolute: 0 10*3/uL (ref 0.0–0.1)
Basophils Relative: 0.3 % (ref 0.0–3.0)
Eosinophils Absolute: 0.1 10*3/uL (ref 0.0–0.7)
Eosinophils Relative: 1.2 % (ref 0.0–5.0)
HCT: 43.2 % (ref 39.0–52.0)
Hemoglobin: 14.9 g/dL (ref 13.0–17.0)
Lymphocytes Relative: 43.5 % (ref 12.0–46.0)
Lymphs Abs: 2.2 10*3/uL (ref 0.7–4.0)
MCHC: 34.6 g/dL (ref 30.0–36.0)
MCV: 96.4 fl (ref 78.0–100.0)
Monocytes Absolute: 0.5 10*3/uL (ref 0.1–1.0)
Monocytes Relative: 9.4 % (ref 3.0–12.0)
Neutro Abs: 2.3 10*3/uL (ref 1.4–7.7)
Neutrophils Relative %: 45.6 % (ref 43.0–77.0)
Platelets: 203 10*3/uL (ref 150.0–400.0)
RBC: 4.49 Mil/uL (ref 4.22–5.81)
RDW: 12.7 % (ref 11.5–15.5)
WBC: 5 10*3/uL (ref 4.0–10.5)

## 2019-05-25 LAB — LIPID PANEL
Cholesterol: 320 mg/dL — ABNORMAL HIGH (ref 0–200)
HDL: 55.3 mg/dL (ref >39.00)
LDL Cholesterol: 233 mg/dL — ABNORMAL HIGH (ref 0–99)
NonHDL: 264.84
Total CHOL/HDL Ratio: 6
Triglycerides: 161 mg/dL — ABNORMAL HIGH (ref 0.0–149.0)
VLDL: 32.2 mg/dL (ref 0.0–40.0)

## 2019-05-25 NOTE — Progress Notes (Signed)
Patient presents to clinic today for annual exam.  Patient is fasting for labs.  Diet -- Endorses very well-balanced clean diet overall. Is keeping very well hydrated throughout the day.  Exercise -- Staying very active - running 3 x week, boxing 1-2 days per week.   Body mass index is 27.28 kg/m.  Acute Concerns: Denies acute concerns at today's visit.   Chronic Issues: Anxiety/Depression -- Patient on a regimen of Sertraline 25 mg daily. Endorses taking daily as directed.  Tolerating well with great improvement in symptoms. Notes with combination of medication, clean diet and exercise his symptoms are well controlled. Denies SI/HI. Sleeping well at night.    Health Maintenance: Immunizations -- UTD  Past Medical History:  Diagnosis Date  . Anxiety    no current issue -- many years ago  . GERD (gastroesophageal reflux disease)   . History of chicken pox     Past Surgical History:  Procedure Laterality Date  . CLOSED REDUCTION RADIAL HEAD / NECK FRACTURE     rod, screw, pins, plates  . OPEN REDUCTION INTERNAL FIXATION (ORIF) TIBIA/FIBULA FRACTURE  2008   rod & screws; [facility]  . TONSILLECTOMY    . WISDOM TOOTH EXTRACTION      Current Outpatient Medications on File Prior to Visit  Medication Sig Dispense Refill  . finasteride (PROPECIA) 1 MG tablet Take 0.5 mg by mouth. Take 0.5 tablet every 3 days for alopecia    . fluticasone (FLONASE) 50 MCG/ACT nasal spray Place 2 sprays into both nostrils daily. 16 g 5  . Melatonin 3 MG TABS Take 1 tablet by mouth at bedtime.    . multivitamin (THERAGRAN) per tablet Take 1 tablet by mouth daily.      . sertraline (ZOLOFT) 25 MG tablet TAKE 1 TABLET(25 MG) BY MOUTH DAILY. Please schedule your annual physical for further refills [phone] 30 tablet 0   No current facility-administered medications on file prior to visit.    Allergies  Allergen Reactions  . Pollen Extract     Allergic rhinoconjunctivitis  . Penicillins    ? Reaction as child    Family History  Problem Relation Age of Onset  . Hypertension Father        Lving  . Anxiety disorder Father        stress  . Heart disease Father   . Thyroid disease Mother        Living  . Heart attack Maternal Grandmother        in 79s  . Thyroid disease Maternal Grandmother   . Lung cancer Paternal Uncle        SMOKER  . Heart disease Other        Paternal Side  . Healthy Son        x2  . Diabetes Neg Hx   . Stroke Neg Hx     Social History   Socioeconomic History  . Marital status: Single    Spouse name: Not on file  . Number of children: Not on file  . Years of education: Not on file  . Highest education level: Not on file  Occupational History  . Occupation: Firefighter  Tobacco Use  . Smoking status: Former Smoker    Quit date: 02/10/1999    Years since quitting: 20.2  . Smokeless tobacco: Never Used  . Tobacco comment: smoked 1996-2001 , 1 pp MONTH  Substance and Sexual Activity  . Alcohol use: Yes    Alcohol/week: 0.0 standard  drinks    Comment: SOCIALLY  . Drug use: No  . Sexual activity: Yes    Partners: Female    Comment: wife  Other Topics Concern  . Not on file  Social History Narrative   Regular Exercise-3x wkly      Social Determinants of Health   Financial Resource Strain:   . Difficulty of Paying Living Expenses:   Food Insecurity:   . Worried About Charity fundraiser in the Last Year:   . Arboriculturist in the Last Year:   Transportation Needs:   . Film/video editor (Medical):   Marland Kitchen Lack of Transportation (Non-Medical):   Physical Activity:   . Days of Exercise per Week:   . Minutes of Exercise per Session:   Stress:   . Feeling of Stress :   Social Connections:   . Frequency of Communication with Friends and Family:   . Frequency of Social Gatherings with Friends and Family:   . Attends Religious Services:   . Active Member of Clubs or Organizations:   . Attends Archivist  Meetings:   Marland Kitchen Marital Status:   Intimate Partner Violence:   . Fear of Current or Ex-Partner:   . Emotionally Abused:   Marland Kitchen Physically Abused:   . Sexually Abused:     Review of Systems  Constitutional: Negative for fever and weight loss.  HENT: Negative for ear discharge, ear pain, hearing loss and tinnitus.   Eyes: Negative for blurred vision, double vision, photophobia and pain.  Respiratory: Negative for cough and shortness of breath.   Cardiovascular: Negative for chest pain and palpitations.  Gastrointestinal: Negative for abdominal pain, blood in stool, constipation, diarrhea, heartburn, melena, nausea and vomiting.  Genitourinary: Negative for dysuria, flank pain, frequency, hematuria and urgency.       Nocturia x 0  Musculoskeletal: Negative for falls.  Neurological: Negative for dizziness, loss of consciousness and headaches.  Endo/Heme/Allergies: Negative for environmental allergies.  Psychiatric/Behavioral: Negative for depression, hallucinations, substance abuse and suicidal ideas. The patient is not nervous/anxious and does not have insomnia.     BP 122/80   Pulse 61   Temp 98.3 F (36.8 C) (Temporal)   Resp 16   Ht 5\' 6"  (1.676 m)   Wt 169 lb (76.7 kg)   SpO2 99%   BMI 27.28 kg/m   Physical Exam Vitals reviewed.  Constitutional:      General: He is not in acute distress.    Appearance: He is well-developed. He is not diaphoretic.  HENT:     Head: Normocephalic and atraumatic.     Right Ear: Tympanic membrane, ear canal and external ear normal.     Left Ear: Tympanic membrane, ear canal and external ear normal.     Nose: Nose normal.     Mouth/Throat:     Pharynx: No posterior oropharyngeal erythema.  Eyes:     Conjunctiva/sclera: Conjunctivae normal.     Pupils: Pupils are equal, round, and reactive to light.  Neck:     Thyroid: No thyromegaly.  Cardiovascular:     Rate and Rhythm: Normal rate and regular rhythm.     Heart sounds: Normal heart  sounds.  Pulmonary:     Effort: Pulmonary effort is normal. No respiratory distress.     Breath sounds: Normal breath sounds. No wheezing or rales.  Chest:     Chest wall: No tenderness.  Abdominal:     General: Bowel sounds are normal. There is no  distension.     Palpations: Abdomen is soft. There is no mass.     Tenderness: There is no abdominal tenderness. There is no guarding or rebound.  Musculoskeletal:     Cervical back: Neck supple.  Lymphadenopathy:     Cervical: No cervical adenopathy.  Skin:    General: Skin is warm and dry.     Findings: No rash.  Neurological:     Mental Status: He is alert and oriented to person, place, and time.     Cranial Nerves: No cranial nerve deficit.    Assessment/Plan: 1. Visit for preventive health examination Depression screen negative. Health Maintenance reviewed. Preventive schedule discussed and handout given in AVS. Will obtain fasting labs today.  - CBC with Differential/Platelet - Comprehensive metabolic panel  2. Hyperlipidemia, unspecified hyperlipidemia type Good diet and exercise regimen. Continue same. Repeat fasting lipids today. - Lipid panel  3. Anxiety and depression Doing very well. Continue current regimen.   This visit occurred during the SARS-CoV-2 public health emergency.  Safety protocols were in place, including screening questions prior to the visit, additional usage of staff PPE, and extensive cleaning of exam room while observing appropriate contact time as indicated for disinfecting solutions.      Piedad Climes, PA-C

## 2019-05-25 NOTE — Patient Instructions (Signed)
Please go to the lab for blood work.   Our office will call you with your results unless you have chosen to receive results via MyChart.  If your blood work is normal we will follow-up each year for physicals and as scheduled for chronic medical problems.  If anything is abnormal we will treat accordingly and get you in for a follow-up.   Preventive Care 40-41 Years Old, Male Preventive care refers to lifestyle choices and visits with your health care provider that can promote health and wellness. This includes:  A yearly physical exam. This is also called an annual well check.  Regular dental and eye exams.  Immunizations.  Screening for certain conditions.  Healthy lifestyle choices, such as eating a healthy diet, getting regular exercise, not using drugs or products that contain nicotine and tobacco, and limiting alcohol use. What can I expect for my preventive care visit? Physical exam Your health care provider will check:  Height and weight. These may be used to calculate body mass index (BMI), which is a measurement that tells if you are at a healthy weight.  Heart rate and blood pressure.  Your skin for abnormal spots. Counseling Your health care provider may ask you questions about:  Alcohol, tobacco, and drug use.  Emotional well-being.  Home and relationship well-being.  Sexual activity.  Eating habits.  Work and work environment. What immunizations do I need?  Influenza (flu) vaccine  This is recommended every year. Tetanus, diphtheria, and pertussis (Tdap) vaccine  You may need a Td booster every 10 years. Varicella (chickenpox) vaccine  You may need this vaccine if you have not already been vaccinated. Zoster (shingles) vaccine  You may need this after age 60. Measles, mumps, and rubella (MMR) vaccine  You may need at least one dose of MMR if you were born in 1957 or later. You may also need a second dose. Pneumococcal conjugate (PCV13)  vaccine  You may need this if you have certain conditions and were not previously vaccinated. Pneumococcal polysaccharide (PPSV23) vaccine  You may need one or two doses if you smoke cigarettes or if you have certain conditions. Meningococcal conjugate (MenACWY) vaccine  You may need this if you have certain conditions. Hepatitis A vaccine  You may need this if you have certain conditions or if you travel or work in places where you may be exposed to hepatitis A. Hepatitis B vaccine  You may need this if you have certain conditions or if you travel or work in places where you may be exposed to hepatitis B. Haemophilus influenzae type b (Hib) vaccine  You may need this if you have certain risk factors. Human papillomavirus (HPV) vaccine  If recommended by your health care provider, you may need three doses over 6 months. You may receive vaccines as individual doses or as more than one vaccine together in one shot (combination vaccines). Talk with your health care provider about the risks and benefits of combination vaccines. What tests do I need? Blood tests  Lipid and cholesterol levels. These may be checked every 5 years, or more frequently if you are over 50 years old.  Hepatitis C test.  Hepatitis B test. Screening  Lung cancer screening. You may have this screening every year starting at age 55 if you have a 30-pack-year history of smoking and currently smoke or have quit within the past 15 years.  Prostate cancer screening. Recommendations will vary depending on your family history and other risks.  Colorectal cancer   screening. All adults should have this screening starting at age 50 and continuing until age 75. Your health care provider may recommend screening at age 45 if you are at increased risk. You will have tests every 1-10 years, depending on your results and the type of screening test.  Diabetes screening. This is done by checking your blood sugar (glucose) after  you have not eaten for a while (fasting). You may have this done every 1-3 years.  Sexually transmitted disease (STD) testing. Follow these instructions at home: Eating and drinking  Eat a diet that includes fresh fruits and vegetables, whole grains, lean protein, and low-fat dairy products.  Take vitamin and mineral supplements as recommended by your health care provider.  Do not drink alcohol if your health care provider tells you not to drink.  If you drink alcohol: ? Limit how much you have to 0-2 drinks a day. ? Be aware of how much alcohol is in your drink. In the [place] one drink equals one 12 oz bottle of beer (355 mL), one 5 oz glass of wine (148 mL), or one 1 oz glass of hard liquor (44 mL). Lifestyle  Take daily care of your teeth and gums.  Stay active. Exercise for at least 30 minutes on 5 or more days each week.  Do not use any products that contain nicotine or tobacco, such as cigarettes, e-cigarettes, and chewing tobacco. If you need help quitting, ask your health care provider.  If you are sexually active, practice safe sex. Use a condom or other form of protection to prevent STIs (sexually transmitted infections).  Talk with your health care provider about taking a low-dose aspirin every day starting at age 50. What's next?  Go to your health care provider once a year for a well check visit.  Ask your health care provider how often you should have your eyes and teeth checked.  Stay up to date on all vaccines. This information is not intended to replace advice given to you by your health care provider. Make sure you discuss any questions you have with your health care provider. Document Revised: 01/20/2018 Document Reviewed: 01/20/2018 Elsevier Patient Education  2020 Elsevier Inc[employer] .      

## 2019-05-26 ENCOUNTER — Encounter: Payer: Self-pay | Admitting: Physician Assistant

## 2019-05-26 ENCOUNTER — Other Ambulatory Visit: Payer: Self-pay | Admitting: Emergency Medicine

## 2019-05-26 DIAGNOSIS — E785 Hyperlipidemia, unspecified: Secondary | ICD-10-CM

## 2019-05-26 MED ORDER — ATORVASTATIN CALCIUM 10 MG PO TABS: 10.0000 mg | ORAL_TABLET | Freq: Every day | ORAL | 1 refills | Status: DC

## 2019-05-29 NOTE — Telephone Encounter (Signed)
The reason for starting at a lower dose in his case was to try to avoid medication side-effects (more likely with higher doses) by having him adjust to a lower dose before increasing. I am happy to start at a higher dose if he would like -- would not start higher than 20 mg Crestor to begin with, especially in absence of comorbid conditions. Ok to send in Rx Crestor 20 mg once daily. Quantity 30 with 2 refills. Repeat fasting lipids and LFTS in 8 weeks.

## 2019-05-29 NOTE — Telephone Encounter (Signed)
Pt called in asking for speak with Patina about the medication that was being sent in for him and to see if there are any substitutes? Please advise and pt can be reached at the home #

## 2019-05-29 NOTE — Telephone Encounter (Signed)
Spoke with patient about his cholesterol levels and medications to help lower levels.  He has spoken with his Cardiologist friend and discussed medications.  He wanted to start the generic Crestor 40 mg to help get his numbers down or higher dose of Lipitor. He doesn't want to slowly get his numbers down. He is wanting to be aggressive with getting his numbers below normal. Patient wanted your thoughts on what to do.

## 2019-05-30 ENCOUNTER — Other Ambulatory Visit: Payer: Self-pay | Admitting: Emergency Medicine

## 2019-05-30 DIAGNOSIS — E785 Hyperlipidemia, unspecified: Secondary | ICD-10-CM

## 2019-05-30 MED ORDER — ROSUVASTATIN CALCIUM 20 MG PO TABS: 20.0000 mg | ORAL_TABLET | Freq: Every day | ORAL | 1 refills | Status: DC

## 2019-05-30 NOTE — Telephone Encounter (Signed)
Advised patient of PCP recommendations. He is agreeable with starting the Crestor 20 mg daily, Rx sent to the pharmacy and future labs ordered. Will remind patient to schedule a lab appointment in 8 weeks.

## 2019-05-31 ENCOUNTER — Encounter: Payer: Self-pay | Admitting: Physician Assistant

## 2019-05-31 ENCOUNTER — Other Ambulatory Visit: Payer: Self-pay | Admitting: Emergency Medicine

## 2019-05-31 DIAGNOSIS — F32A Depression, unspecified: Secondary | ICD-10-CM

## 2019-05-31 DIAGNOSIS — F329 Major depressive disorder, single episode, unspecified: Secondary | ICD-10-CM

## 2019-05-31 MED ORDER — SERTRALINE HCL 25 MG PO TABS: ORAL_TABLET | ORAL | 1 refills | Status: DC

## 2019-06-01 NOTE — Telephone Encounter (Signed)
Patina already handling. The Rosuvastatin he should be on. The pharmacy was supposed to restock the Atorvastatin initially sent in.

## 2019-08-16 ENCOUNTER — Other Ambulatory Visit: Payer: Self-pay

## 2019-08-16 ENCOUNTER — Encounter: Payer: Self-pay | Admitting: Physician Assistant

## 2019-08-16 DIAGNOSIS — M25519 Pain in unspecified shoulder: Secondary | ICD-10-CM

## 2019-08-16 DIAGNOSIS — E785 Hyperlipidemia, unspecified: Secondary | ICD-10-CM

## 2019-09-05 NOTE — Telephone Encounter (Signed)
He would need appointment in order for me to proceed with imaging. Otherwise we can set him up with Sports Medicine as they can evaluate and getting x-ray or [place] at time of office visit.

## 2019-09-12 ENCOUNTER — Other Ambulatory Visit (INDEPENDENT_AMBULATORY_CARE_PROVIDER_SITE_OTHER): Payer: Managed Care, Other (non HMO)

## 2019-09-12 ENCOUNTER — Other Ambulatory Visit: Payer: Self-pay

## 2019-09-12 DIAGNOSIS — E785 Hyperlipidemia, unspecified: Secondary | ICD-10-CM | POA: Diagnosis not present

## 2019-09-12 LAB — LIPID PANEL
Cholesterol: 212 mg/dL — ABNORMAL HIGH (ref 0–200)
HDL: 62.8 mg/dL (ref >39.00)
LDL Cholesterol: 119 mg/dL — ABNORMAL HIGH (ref 0–99)
NonHDL: 149.25
Total CHOL/HDL Ratio: 3
Triglycerides: 149 mg/dL (ref 0.0–149.0)
VLDL: 29.8 mg/dL (ref 0.0–40.0)

## 2019-09-12 LAB — HEPATIC FUNCTION PANEL
ALT: 30 U/L (ref 0–53)
AST: 24 U/L (ref 0–37)
Albumin: 4.9 g/dL (ref 3.5–5.2)
Alkaline Phosphatase: 43 U/L (ref 39–117)
Bilirubin, Direct: 0.1 mg/dL (ref 0.0–0.3)
Total Bilirubin: 0.6 mg/dL (ref 0.2–1.2)
Total Protein: 7.8 g/dL (ref 6.0–8.3)

## 2019-09-19 ENCOUNTER — Ambulatory Visit: Payer: Managed Care, Other (non HMO) | Admitting: Family Medicine

## 2019-09-19 ENCOUNTER — Ambulatory Visit: Payer: Self-pay

## 2019-09-19 ENCOUNTER — Other Ambulatory Visit: Payer: Self-pay

## 2019-09-19 ENCOUNTER — Encounter: Payer: Self-pay | Admitting: Family Medicine

## 2019-09-19 VITALS — BP 120/84 | HR 71 | Ht 66.0 in | Wt 174.2 lb

## 2019-09-19 DIAGNOSIS — G8929 Other chronic pain: Secondary | ICD-10-CM

## 2019-09-19 DIAGNOSIS — M25512 Pain in left shoulder: Secondary | ICD-10-CM | POA: Diagnosis not present

## 2019-09-19 NOTE — Patient Instructions (Signed)
Thank you for coming in today. Resume home exercise.  If not improving in a few weeks let me know and I will order PT.  Pt can be done at [facility] or [place]..  Call or go to the ER if you develop a large red swollen joint with extreme pain or oozing puss.   MRI in the future if not better.

## 2019-09-19 NOTE — Progress Notes (Signed)
Subjective:    I'm seeing this patient as a consultation for:  Marcelline Mates, PA-C. Note will be routed back to referring provider/PCP.  CC: L shoulder pain  I, Molly Weber, LAT, ATC, am serving as scribe for Dr. Clementeen Graham.  HPI: Pt is a 39 y/o male presenting w/ c/o L shoulder pain x one year w/ no known MOI.  He locates his pain to his L ant shoulder that radiates into his L upper arm .  He exercises frequently doing weight lifting and MMA type events.  Pain is bad enough that it is interfering with his ability to exercise and weight left.  Additionally pain is interfering with sleep.  He is done a lot of home exercise program already including band and scapular stabilizing exercises.  Radiating pain: yes into his L upper arm Neck pain: yes L shoulder mechanical symptoms: No L UE numbness/tingling: yes in his L fingertips Aggravating factors: push-ups; chest press; delt raises front and lateral Treatments tried: Chiro for his neck.  Home exercise program  Past medical history, Surgical history, Family history, Social history, Allergies, and medications have been entered into the medical record, reviewed.   Review of Systems: No new headache, visual changes, nausea, vomiting, diarrhea, constipation, dizziness, abdominal pain, skin rash, fevers, chills, night sweats, weight loss, swollen lymph nodes, body aches, joint swelling, muscle aches, chest pain, shortness of breath, mood changes, visual or auditory hallucinations.   Objective:    Vitals:   09/19/19 1116  BP: 120/84  Pulse: 71  SpO2: 97%   General: Well Developed, well nourished, and in no acute distress.  Neuro/Psych: Alert and oriented x3, extra-ocular muscles intact, able to move all 4 extremities, sensation grossly intact. Skin: Warm and dry, no rashes noted.  Respiratory: Not using accessory muscles, speaking in full sentences, trachea midline.  Cardiovascular: Pulses palpable, no extremity edema. Abdomen: Does  not appear distended. MSK: Left shoulder normal-appearing Nontender. Normal shoulder motion. Strength intact abduction external and internal rotation. Pain is worse with resisted abduction and internal rotation. Positive Hawkins and Neer's test.  Positive empty can test. Mildly positive Yergason's and speeds test. Pulses cap refill and sensation are intact distally.  Lab and Radiology Results  Diagnostic Limited MSK Ultrasound of: Left shoulder Biceps tendon intact normal-appearing in bicipital groove. Subscapularis tendon intact.  Possible hypoechoic change mid substance tendon without gap.  No increased Doppler activity in this area.  Either fascicle or tiny partial rotator cuff tear. Supraspinatus tendon is intact and normal appearing. Very large subacromial bursa present. Infraspinatus tendon normal-appearing AC joint mildly narrowed degenerative with effusion. Impression: Subacromial bursitis  Procedure: Real-time Ultrasound Guided Injection of left shoulder subacromial bursa Device: Philips Affiniti 50G Images permanently stored and available for review in the ultrasound unit. Verbal informed consent obtained.  Discussed risks and benefits of procedure. Warned about infection bleeding damage to structures skin hypopigmentation and fat atrophy among others. Patient expresses understanding and agreement Time-out conducted.   Noted no overlying erythema, induration, or other signs of local infection.   Skin prepped in a sterile fashion.   Local anesthesia: Topical Ethyl chloride.   With sterile technique and under real time ultrasound guidance:  40 mg of Kenalog and 2 mL of Marcaine injected easily.   Completed without difficulty   Pain immediately resolved suggesting accurate placement of the medication.   Advised to call if fevers/chills, erythema, induration, drainage, or persistent bleeding.   Images permanently stored and available for review in the ultrasound  unit.    Impression: Technically successful ultrasound guided injection.     Impression and Recommendations:    Assessment and Plan: 39 y.o. male with left shoulder pain persistent for over a year.  Some improvement but not full resolution with home exercise program.  Ultrasound shows very large subacromial bursitis.  Plan to treat with injection today.  Patient had great response immediately to the injection.  We will continue home exercise program and recheck back as needed.  If pain returns or does not fully resolve next step should be formal physical therapy followed by MRI if needed..   Orders Placed This Encounter  Procedures  . [place] LIMITED JOINT SPACE STRUCTURES UP LEFT(NO LINKED CHARGES)    Order Specific Question:   Reason for Exam (SYMPTOM  OR DIAGNOSIS REQUIRED)    Answer:   L shoulder pain    Order Specific Question:   Preferred imaging location?    Answer:   [facility]   No orders of the defined types were placed in this encounter.   Discussed warning signs or symptoms. Please see discharge instructions. Patient expresses understanding.   The above documentation has been reviewed and is accurate and complete Clementeen Graham, M.D.

## 2019-10-07 ENCOUNTER — Encounter: Payer: Self-pay | Admitting: Physician Assistant

## 2019-10-30 ENCOUNTER — Other Ambulatory Visit: Payer: Self-pay

## 2019-10-30 ENCOUNTER — Encounter: Payer: Self-pay | Admitting: Family Medicine

## 2019-10-30 ENCOUNTER — Ambulatory Visit: Payer: Managed Care, Other (non HMO) | Admitting: Family Medicine

## 2019-10-30 VITALS — BP 122/82 | HR 62 | Ht 66.0 in | Wt 172.0 lb

## 2019-10-30 DIAGNOSIS — G8929 Other chronic pain: Secondary | ICD-10-CM

## 2019-10-30 DIAGNOSIS — M25512 Pain in left shoulder: Secondary | ICD-10-CM | POA: Diagnosis not present

## 2019-10-30 NOTE — Patient Instructions (Addendum)
Thank you for coming in today. Plan for MRI arthrogram.  You should hear about scheduling soon.   Dr T will offer a cortisone shot as part of the arthrogram I recommend it.   You should hear soon about scheduling.   Recheck following MRI.

## 2019-10-30 NOTE — Progress Notes (Signed)
   Wynema Birch, am serving as a Neurosurgeon for Dr. Clementeen Graham.  Brett Wright is a 39 y.o. male who presents to [facility] today for f/u of chronic L shoulder pain.  He was last seen by Dr. Denyse Amass on 09/19/19 and reported pain w/ lifting weights and MMA-type activities.  He had a L subacromial injection and was advised to resume his HEP. Since his last visit, pt reports That the injection he received at his last visit worked for about a week and then the pain slowly started coming back. States has been doing a lot of rehab to help but the pain recently has gotten bad enough where rehab was becoming painful. States that motions of turning a doorknob hurts where lifting weights does not.    Pertinent review of systems: No fevers or chills  Relevant historical information: Hyperlipidemia   Exam:  BP 122/82 (BP Location: Left Arm, Patient Position: Sitting, Cuff Size: Normal)   Pulse 62   Ht 5\' 6"  (1.676 m)   Wt 172 lb (78 kg)   SpO2 96%   BMI 27.76 kg/m  General: Well Developed, well nourished, and in no acute distress.   MSK: Left shoulder normal-appearing normal motion.  Nontender. Strength intact abduction external/internal rotation. Positive empty can test positive Hawkins and Neer's test positive O'Brien's test. Positive clunk test. Negative Yergason's and speeds test.      Assessment and Plan: 39 y.o. male with left shoulder pain concerning for SLAP tear and bursitis.  Failing to improve with conservative management including injection and home exercise program.  Plan for MRI arthrogram to further characterize cause of pain.  Recheck after MRI.   PDMP not reviewed this encounter. Orders Placed This Encounter  Procedures  . MR Shoulder Left W Contrast    Arthrogram.  No IV contrast    Standing Status:   Future    Standing Expiration Date:   10/29/2020    Scheduling Instructions:     Schedule with Dr T 1 hour prior to MRI for arthrogram  injection    Order Specific Question:   If indicated for the ordered procedure, I authorize the administration of contrast media per Radiology protocol    Answer:   Yes    Order Specific Question:   What is the patient's sedation requirement?    Answer:   No Sedation    Order Specific Question:   Does the patient have a pacemaker or implanted devices?    Answer:   No    Order Specific Question:   Radiology Contrast Protocol - do NOT remove file path    Answer:   [email or web address]    Order Specific Question:   Preferred imaging location?    Answer:   10/31/2020 (table limit-350lbs)   No orders of the defined types were placed in this encounter.    Discussed warning signs or symptoms. Please see discharge instructions. Patient expresses understanding.   The above documentation has been reviewed and is accurate and complete Licensed conveyancer, M.D.

## 2019-11-02 ENCOUNTER — Telehealth: Payer: Self-pay | Admitting: Family Medicine

## 2019-11-02 DIAGNOSIS — G8929 Other chronic pain: Secondary | ICD-10-CM

## 2019-11-02 NOTE — Telephone Encounter (Signed)
Insurances require me to obtain an x-ray of your shoulder prior to approving MRI.  Have ordered an x-ray.  Please call my office to schedule the x-ray so that we can get the MRI approved.

## 2019-11-21 ENCOUNTER — Ambulatory Visit (INDEPENDENT_AMBULATORY_CARE_PROVIDER_SITE_OTHER)
Admission: RE | Admit: 2019-11-21 | Discharge: 2019-11-21 | Disposition: A | Discharge status: Home / Self Care | Payer: Managed Care, Other (non HMO) | Source: Ambulatory Visit | Attending: Family Medicine | Admitting: Family Medicine

## 2019-11-21 ENCOUNTER — Other Ambulatory Visit: Payer: Self-pay

## 2019-11-21 DIAGNOSIS — M25512 Pain in left shoulder: Secondary | ICD-10-CM

## 2019-11-21 DIAGNOSIS — G8929 Other chronic pain: Secondary | ICD-10-CM

## 2019-11-23 NOTE — Progress Notes (Signed)
Shoulder x-ray looks normal to radiology

## 2020-01-11 ENCOUNTER — Encounter: Payer: Self-pay | Admitting: Physician Assistant

## 2020-04-03 ENCOUNTER — Other Ambulatory Visit: Payer: Self-pay | Admitting: Physician Assistant

## 2020-04-03 DIAGNOSIS — F419 Anxiety disorder, unspecified: Secondary | ICD-10-CM

## 2020-04-03 DIAGNOSIS — F32A Depression, unspecified: Secondary | ICD-10-CM

## 2020-05-02 ENCOUNTER — Other Ambulatory Visit: Payer: Self-pay

## 2020-05-02 DIAGNOSIS — E785 Hyperlipidemia, unspecified: Secondary | ICD-10-CM

## 2020-05-02 MED ORDER — ROSUVASTATIN CALCIUM 20 MG PO TABS: 20.0000 mg | ORAL_TABLET | Freq: Every day | ORAL | 0 refills | Status: DC

## 2020-05-29 ENCOUNTER — Telehealth: Payer: Self-pay

## 2020-05-29 NOTE — Telephone Encounter (Signed)
No additinal notes required  

## 2020-08-16 ENCOUNTER — Other Ambulatory Visit: Payer: Self-pay | Admitting: Family Medicine

## 2020-08-16 ENCOUNTER — Telehealth: Payer: Self-pay | Admitting: Family Medicine

## 2020-08-16 DIAGNOSIS — E785 Hyperlipidemia, unspecified: Secondary | ICD-10-CM

## 2020-08-19 NOTE — Telephone Encounter (Signed)
Patient has not been seen in 15 months and has no scheduled appointmen

## 2020-08-21 ENCOUNTER — Other Ambulatory Visit: Payer: Self-pay | Admitting: Family Medicine

## 2020-08-21 DIAGNOSIS — E785 Hyperlipidemia, unspecified: Secondary | ICD-10-CM

## 2020-08-21 NOTE — Telephone Encounter (Signed)
Medication sent by Worthy Rancher.

## 2020-08-21 NOTE — Telephone Encounter (Signed)
Patient has a transfer of care scheduled for October with Dr. Meredith Staggers - please send his medication renewal in.

## 2020-08-29 ENCOUNTER — Other Ambulatory Visit: Payer: Self-pay | Admitting: Family

## 2020-08-29 DIAGNOSIS — F32A Depression, unspecified: Secondary | ICD-10-CM

## 2020-08-29 DIAGNOSIS — F419 Anxiety disorder, unspecified: Secondary | ICD-10-CM

## 2020-10-09 ENCOUNTER — Telehealth: Payer: Self-pay | Admitting: Physician Assistant

## 2020-10-09 ENCOUNTER — Other Ambulatory Visit: Payer: Self-pay

## 2020-10-09 DIAGNOSIS — F419 Anxiety disorder, unspecified: Secondary | ICD-10-CM

## 2020-10-09 DIAGNOSIS — F32A Depression, unspecified: Secondary | ICD-10-CM

## 2020-10-09 MED ORDER — SERTRALINE HCL 25 MG PO TABS: ORAL_TABLET | ORAL | 0 refills | Status: DC

## 2020-10-09 NOTE — Telephone Encounter (Signed)
Patient needs his sertaline refilled and sent to [facility] on [address] in [facility] - He has an upcoming appointment with Dr. Neva Seat -

## 2020-10-09 NOTE — Telephone Encounter (Signed)
Medication sent to pharmacy  

## 2020-11-22 ENCOUNTER — Other Ambulatory Visit: Payer: Self-pay

## 2020-11-22 ENCOUNTER — Encounter: Payer: Self-pay | Admitting: Family Medicine

## 2020-11-22 ENCOUNTER — Ambulatory Visit: Payer: Managed Care, Other (non HMO) | Admitting: Family Medicine

## 2020-11-22 VITALS — BP 128/70 | HR 55 | Temp 98.2°F | Resp 16 | Ht 66.0 in | Wt 176.6 lb

## 2020-11-22 DIAGNOSIS — F419 Anxiety disorder, unspecified: Secondary | ICD-10-CM

## 2020-11-22 DIAGNOSIS — Z1329 Encounter for screening for other suspected endocrine disorder: Secondary | ICD-10-CM | POA: Diagnosis not present

## 2020-11-22 DIAGNOSIS — F32A Depression, unspecified: Secondary | ICD-10-CM

## 2020-11-22 DIAGNOSIS — Z23 Encounter for immunization: Secondary | ICD-10-CM

## 2020-11-22 DIAGNOSIS — E785 Hyperlipidemia, unspecified: Secondary | ICD-10-CM | POA: Diagnosis not present

## 2020-11-22 LAB — COMPREHENSIVE METABOLIC PANEL
ALT: 50 U/L (ref 0–53)
AST: 30 U/L (ref 0–37)
Albumin: 5 g/dL (ref 3.5–5.2)
Alkaline Phosphatase: 44 U/L (ref 39–117)
BUN: 12 mg/dL (ref 6–23)
CO2: 30 mEq/L (ref 19–32)
Calcium: 10 mg/dL (ref 8.4–10.5)
Chloride: 99 mEq/L (ref 96–112)
Creatinine, Ser: 0.86 mg/dL (ref 0.40–1.50)
GFR: 106.63 mL/min (ref >60.00)
Glucose, Bld: 95 mg/dL (ref 70–99)
Potassium: 4.3 mEq/L (ref 3.5–5.1)
Sodium: 137 mEq/L (ref 135–145)
Total Bilirubin: 0.6 mg/dL (ref 0.2–1.2)
Total Protein: 7.9 g/dL (ref 6.0–8.3)

## 2020-11-22 LAB — TSH: TSH: 1.54 u[IU]/mL (ref 0.35–5.50)

## 2020-11-22 LAB — LIPID PANEL
Cholesterol: 264 mg/dL — ABNORMAL HIGH (ref 0–200)
HDL: 65.8 mg/dL (ref >39.00)
LDL Cholesterol: 180 mg/dL — ABNORMAL HIGH (ref 0–99)
NonHDL: 197.74
Total CHOL/HDL Ratio: 4
Triglycerides: 87 mg/dL (ref 0.0–149.0)
VLDL: 17.4 mg/dL (ref 0.0–40.0)

## 2020-11-22 MED ORDER — ROSUVASTATIN CALCIUM 20 MG PO TABS: 20.0000 mg | ORAL_TABLET | Freq: Every day | ORAL | 3 refills | Status: DC

## 2020-11-22 MED ORDER — SERTRALINE HCL 25 MG PO TABS: ORAL_TABLET | ORAL | 3 refills | Status: DC

## 2020-11-22 NOTE — Patient Instructions (Addendum)
I will check lipids today. With your family history would like to see the LDL below 100.  I will check some other labs today. Restart exercise with cardio most day per week - goal of 150 minutes per week total. Resistance exercise of major muscle groups twice per week. Start low and go slow with restart of exercise.  If difficulty with muscle maintenance or exercise, follow up and we can look at testosterone or other causes.  Thanks for coming in today. Let me know if there are any questions.

## 2020-11-22 NOTE — Progress Notes (Signed)
Subjective:  Patient ID: Brett Wright, male    DOB: 04-Aug-1980  Age: 40 y.o. MRN: [id number]  CC:  Chief Complaint  Patient presents with   Establish Care    Pt here for [facility] from [facility] today, in need of refills today no need for changes in medications     HPI Brett Wright presents for   New patient to establish care.  Previous primary care provider Brett Cogan, PA-C. Works in [employer], lives in [place]. Wife and 2 sons- 7yo and 42yo. Wife is Engineer, civil (consulting), Interior and spatial designer of nursing at [employer].  Family in town.   Hyperlipidemia: Crestor 20 mg daily. No new myalgias/side effects.  Father with hx of 2v CABG due to CAD - mid 98's.  Mom with hld as well. Lab Results  Component Value Date   CHOL 212 (H) 09/12/2019   HDL 62.80 09/12/2019   LDLCALC 119 (H) 09/12/2019   LDLDIRECT 176.0 06/29/2017   TRIG 149.0 09/12/2019   CHOLHDL 3 09/12/2019   Lab Results  Component Value Date   ALT 30 09/12/2019   AST 24 09/12/2019   ALKPHOS 43 09/12/2019   BILITOT 0.6 09/12/2019   Anxiety/depression Treated with Zoloft 25 mg daily. Primarily anxiety.  Meds takes off edge. No new side effects. Stable with combo of medication, including diet and exercise.  Was exercising with running, boxing previously. Shoulder bursitis limited exercise - less exercise for awhile, getting back into exercise now. Weight 170 in 2017.  Intermittent diets in past.metabolism more difficult past 5-7 years. Some driving with work and work on [employer].   Wt Readings from Last 3 Encounters:  11/22/20 176 lb 9.6 oz (80.1 kg)  10/30/19 172 lb (78 kg)  09/19/19 174 lb 3.2 oz (79 kg)   No flowsheet data found.  Depression screen [facility] 2/9 11/22/2020 05/25/2019 05/14/2017 01/17/2016 02/15/2015  Decreased Interest 1 0 0 0 0  Down, Depressed, Hopeless 0 0 0 0 0  PHQ - 2 Score 1 0 0 0 0  Altered sleeping 1 0 0 0 -  Tired, decreased energy 1 0 0 0 -  Change in appetite 1 0 0 0 -  Feeling bad or failure about  yourself  0 0 0 0 -  Trouble concentrating 0 0 0 0 -  Moving slowly or fidgety/restless 0 0 0 0 -  Suicidal thoughts 0 0 0 0 -  PHQ-9 Score 4 0 0 0 -  Difficult doing work/chores - Not difficult at all Not difficult at all - -   HM: Declined covid vaccine. Had disease 12/2019.  flu vaccine today.    History Patient Active Problem List   Diagnosis Date Noted   Anxiety and depression 09/03/2015   Hyperlipidemia 05/17/2015   Visit for preventive health examination 02/15/2015   Past Medical History:  Diagnosis Date   Anxiety    no current issue -- many years ago   GERD (gastroesophageal reflux disease)    History of chicken pox    Past Surgical History:  Procedure Laterality Date   CLOSED REDUCTION RADIAL HEAD / NECK FRACTURE     rod, screw, pins, plates   OPEN REDUCTION INTERNAL FIXATION (ORIF) TIBIA/FIBULA FRACTURE  2008   rod & screws; [facility]   TONSILLECTOMY     WISDOM TOOTH EXTRACTION     Allergies  Allergen Reactions   Pollen Extract     Allergic rhinoconjunctivitis   Penicillins      ? Reaction as child  Prior to Admission medications   Medication Sig Start Date End Date Taking? Authorizing Provider  finasteride (PROPECIA) 1 MG tablet Take 0.5 mg by mouth. Take 0.5 tablet every 3 days for alopecia    [provider]  fluticasone (FLONASE) 50 MCG/ACT nasal spray Place 2 sprays into both nostrils daily. 05/14/17   Waldon Merl, PA-C  Melatonin 3 MG TABS Take 1 tablet by mouth at bedtime.    [provider]  multivitamin [facility]) per tablet Take 1 tablet by mouth daily.      [provider]  rosuvastatin (CRESTOR) 20 MG tablet TAKE 1 TABLET(20 MG) BY MOUTH DAILY 08/21/20   Worthy Rancher B, FNP  sertraline (ZOLOFT) 25 MG tablet TAKE 1 TABLET(25 MG) BY MOUTH DAILY 10/09/20   Shade Flood, MD   Social History   Socioeconomic History   Marital status: Single    Spouse name: Not on file   Number of children: Not on file   Years  of education: Not on file   Highest education level: Not on file  Occupational History   Occupation: financial advisor  Tobacco Use   Smoking status: Former    Types: Cigarettes    Quit date: 02/10/1999    Years since quitting: 21.7   Smokeless tobacco: Never   Tobacco comments:    smoked 1996-2001 , 1 pp MONTH  Vaping Use   Vaping Use: Never used  Substance and Sexual Activity   Alcohol use: Yes    Alcohol/week: 0.0 standard drinks    Comment: SOCIALLY   Drug use: No   Sexual activity: Yes    Partners: Female    Comment: wife  Other Topics Concern   Not on file  Social History Narrative   Regular Exercise-3x wkly      Social Determinants of Health   Financial Resource Strain: Not on file  Food Insecurity: Not on file  Transportation Needs: Not on file  Physical Activity: Not on file  Stress: Not on file  Social Connections: Not on file  Intimate Partner Violence: Not on file    Review of Systems Per HPI  Objective:   Vitals:   11/22/20 0928  BP: 128/70  Pulse: (!) 55  Resp: 16  Temp: 98.2 F (36.8 C)  TempSrc: Temporal  SpO2: 97%  Weight: 176 lb 9.6 oz (80.1 kg)  Height: 5\' 6"  (1.676 m)     Physical Exam Vitals reviewed.  Constitutional:      Appearance: He is well-developed.  HENT:     Head: Normocephalic and atraumatic.  Neck:     Vascular: No carotid bruit or JVD.  Cardiovascular:     Rate and Rhythm: Normal rate and regular rhythm.     Heart sounds: Normal heart sounds. No murmur heard. Pulmonary:     Effort: Pulmonary effort is normal.     Breath sounds: Normal breath sounds. No rales.  Musculoskeletal:     Right lower leg: No edema.     Left lower leg: No edema.  Skin:    General: Skin is warm and dry.  Neurological:     Mental Status: He is alert and oriented to person, place, and time.  Psychiatric:        Mood and Affect: Mood normal.      Assessment & Plan:  Brett Wright is a 40 y.o. male . Hyperlipidemia, unspecified  hyperlipidemia type - Plan: rosuvastatin (CRESTOR) 20 MG tablet, Comprehensive metabolic panel, Lipid panel -Goal LDL below  100, possible 70 given family history.  Check lipids, continue Crestor same dose for now.  Plan for increased exercise.  Anxiety and depression - Plan: sertraline (ZOLOFT) 25 MG tablet  -Stable with current dose of sertraline.  Continue same  Screening for thyroid disorder - Plan: TSH  -Check TSH, RTC precautions if difficulty with workouts, strength/weakness, and then would consider testosterone or other testing.  Needs flu shot - Plan: Flu Vaccine QUAD 51mo+IM (Fluarix, Fluzone & Alfiuria Quad PF)   Meds ordered this encounter  Medications   rosuvastatin (CRESTOR) 20 MG tablet    Sig: Take 1 tablet (20 mg total) by mouth daily.    Dispense:  90 tablet    Refill:  3   sertraline (ZOLOFT) 25 MG tablet    Sig: TAKE 1 TABLET(25 MG) BY MOUTH DAILY    Dispense:  90 tablet    Refill:  3    Patient Instructions  I will check lipids today. With your family history would like to see the LDL below 100.  I will check some other labs today. Restart exercise with cardio most day per week - goal of 150 minutes per week total. Resistance exercise of major muscle groups twice per week. Start low and go slow with restart of exercise.  If difficulty with muscle maintenance or exercise, follow up and we can look at testosterone or other causes.  Thanks for coming in today. Let me know if there are any questions.      Signed,   Meredith Staggers, MD [facility], [facility] 11/22/20 2:01 PM

## 2020-12-03 ENCOUNTER — Encounter: Payer: Self-pay | Admitting: Family Medicine

## 2020-12-03 DIAGNOSIS — E785 Hyperlipidemia, unspecified: Secondary | ICD-10-CM

## 2020-12-04 MED ORDER — ROSUVASTATIN CALCIUM 40 MG PO TABS: 40.0000 mg | ORAL_TABLET | Freq: Every day | ORAL | 1 refills | Status: DC

## 2020-12-05 ENCOUNTER — Other Ambulatory Visit: Payer: Self-pay | Admitting: Family

## 2020-12-05 DIAGNOSIS — E785 Hyperlipidemia, unspecified: Secondary | ICD-10-CM

## 2021-02-14 IMAGING — DX DG SHOULDER 2+V*L*
3 series · 3 of 3 positions shown · non-contrast
Comparison: None.

CLINICAL DATA: 39-year-old male with left shoulder pain.

EXAM:
LEFT SHOULDER - 2+ VIEW

[grashey]
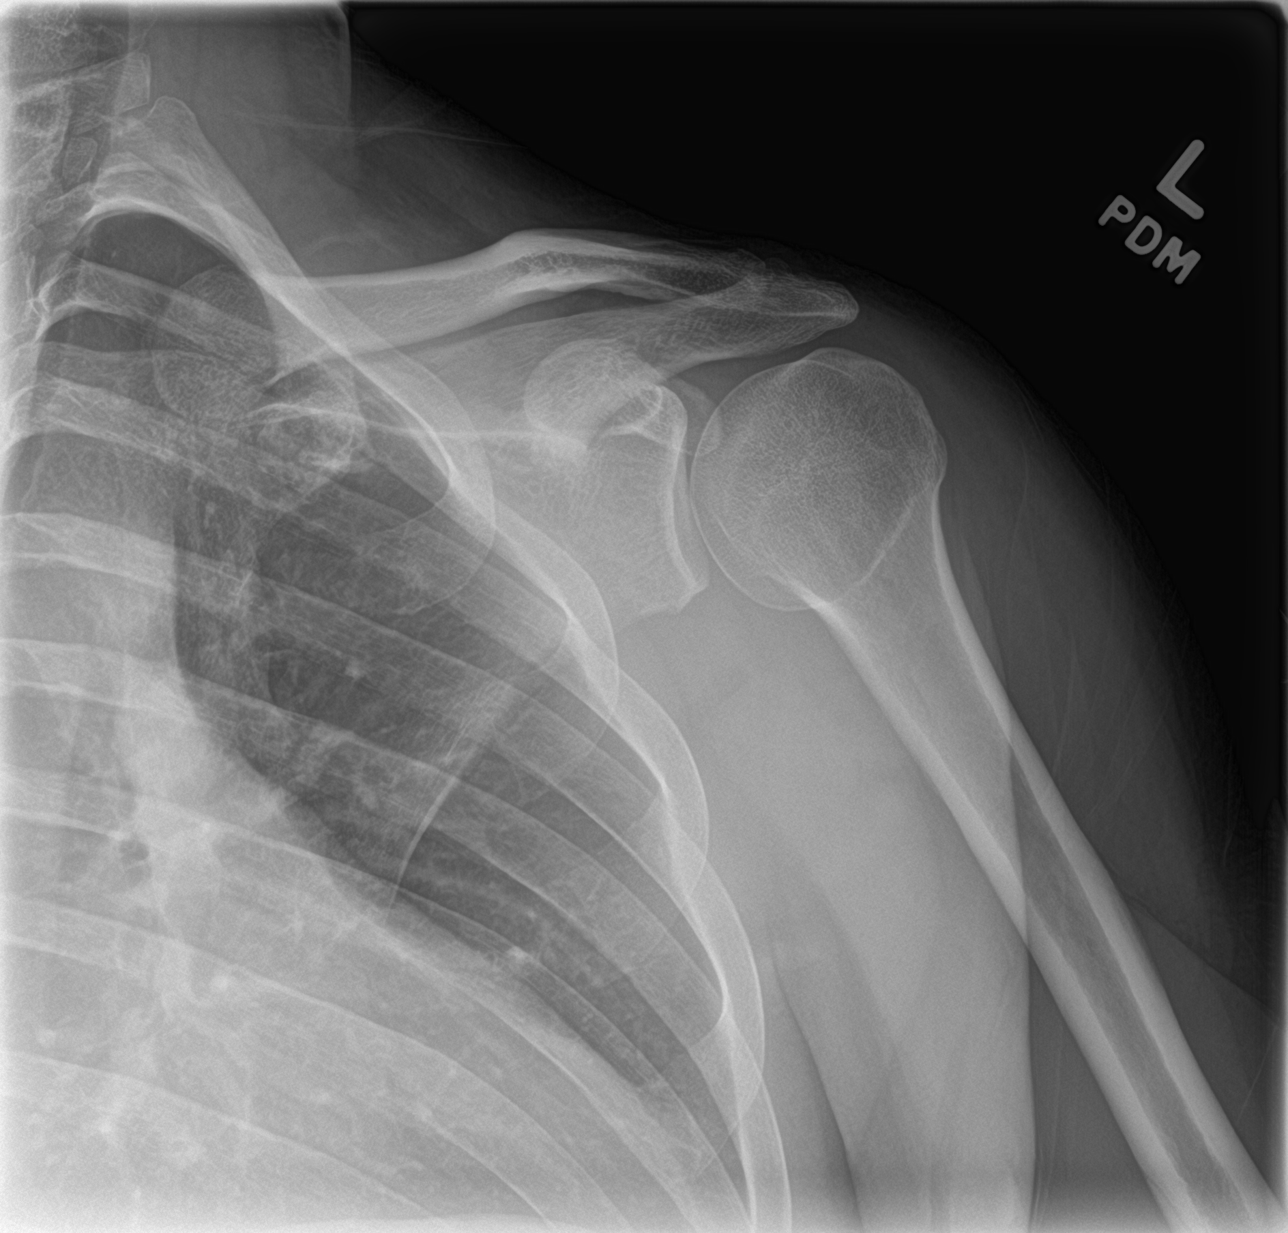

[y view]
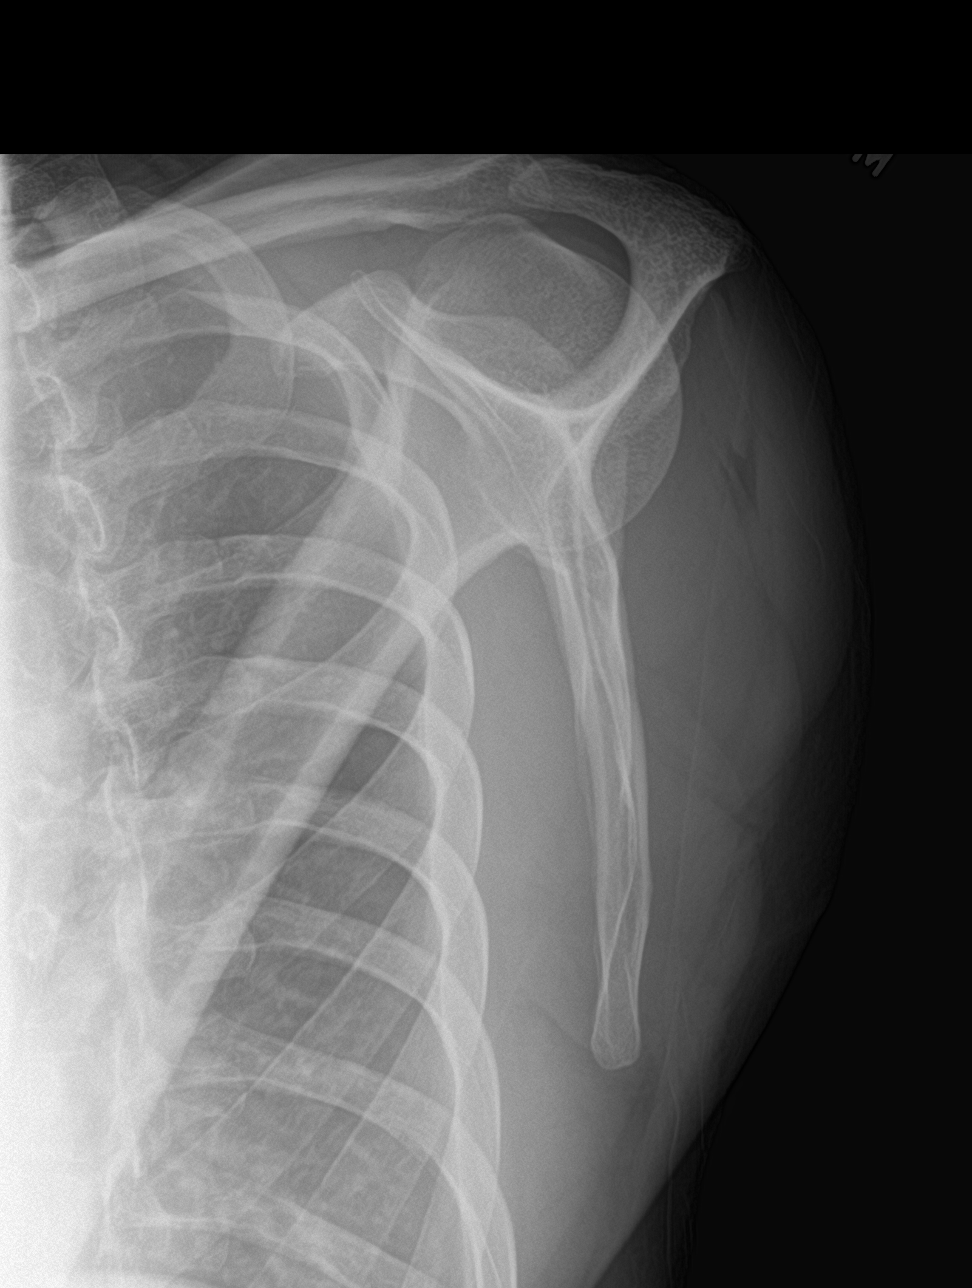

[shoulder axial]
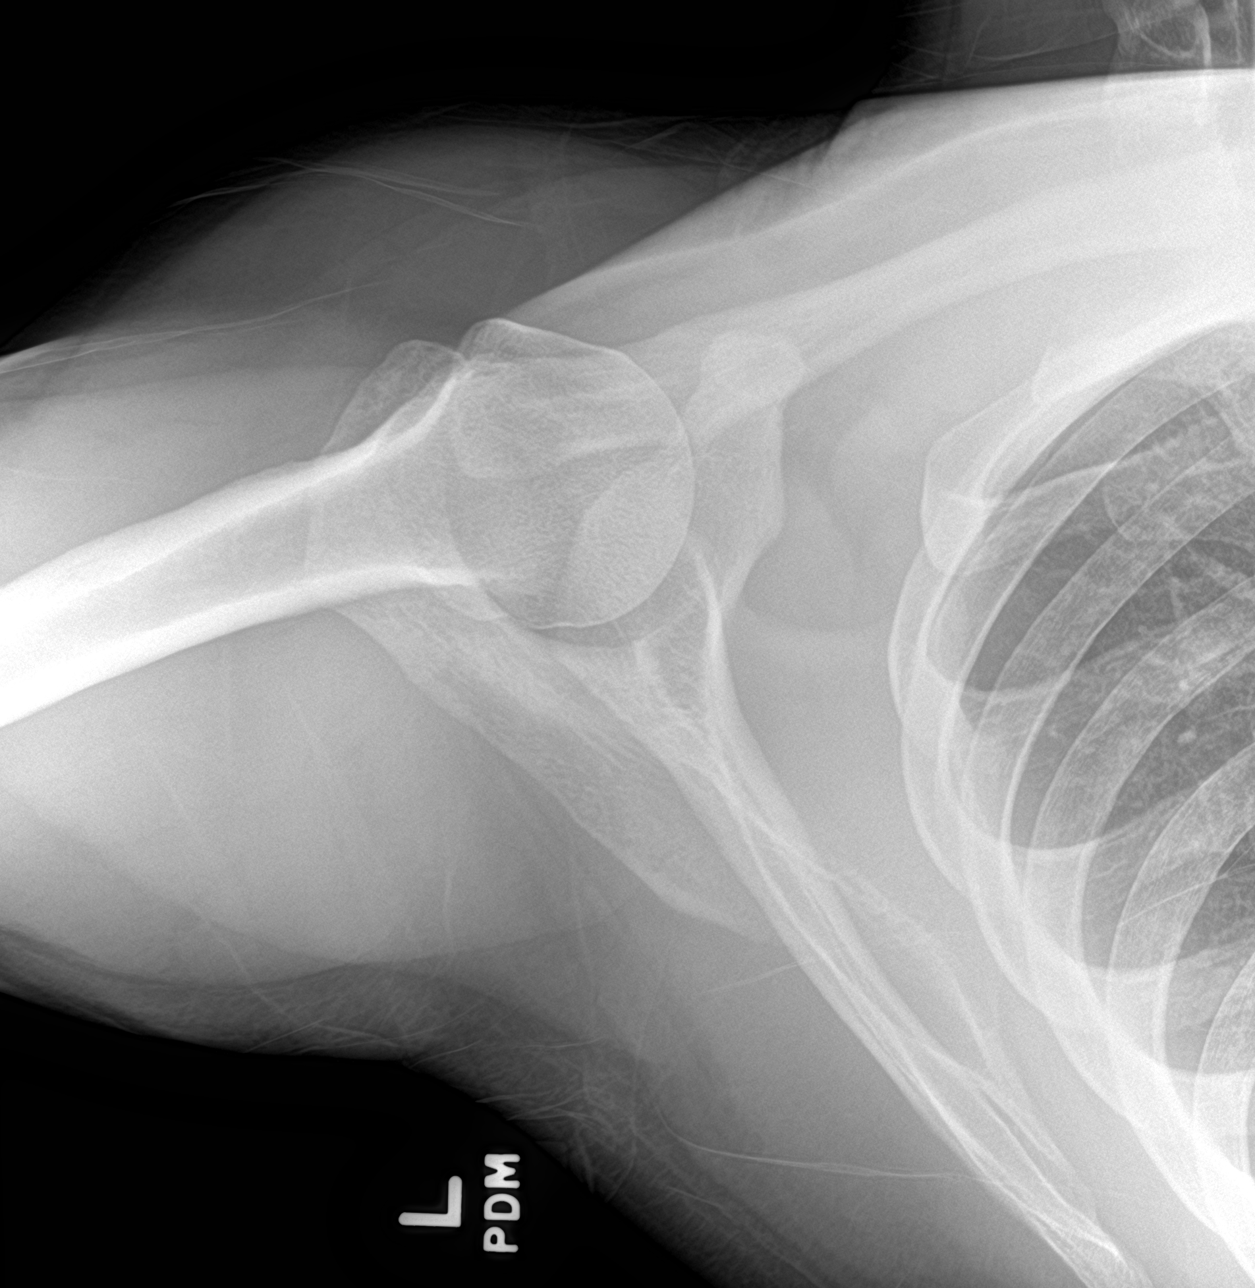

[3 of 3 positions shown; findings below may reference images not displayed]

FINDINGS: There is no acute fracture or dislocation. The bones are well
mineralized. No arthritic changes. The soft tissues are
unremarkable.
IMPRESSION: Negative.

## 2021-05-26 ENCOUNTER — Encounter: Payer: Managed Care, Other (non HMO) | Admitting: Family Medicine

## 2021-06-04 ENCOUNTER — Other Ambulatory Visit: Payer: Self-pay | Admitting: Family Medicine

## 2021-06-04 DIAGNOSIS — E785 Hyperlipidemia, unspecified: Secondary | ICD-10-CM

## 2021-12-03 ENCOUNTER — Encounter: Payer: Self-pay | Admitting: Family Medicine

## 2021-12-03 ENCOUNTER — Ambulatory Visit: Payer: Managed Care, Other (non HMO) | Admitting: Family Medicine

## 2021-12-03 VITALS — BP 128/88 | HR 59 | Temp 98.5°F | Ht 66.0 in | Wt 177.2 lb

## 2021-12-03 DIAGNOSIS — G4700 Insomnia, unspecified: Secondary | ICD-10-CM | POA: Diagnosis not present

## 2021-12-03 DIAGNOSIS — E785 Hyperlipidemia, unspecified: Secondary | ICD-10-CM

## 2021-12-03 DIAGNOSIS — F419 Anxiety disorder, unspecified: Secondary | ICD-10-CM

## 2021-12-03 DIAGNOSIS — M545 Low back pain, unspecified: Secondary | ICD-10-CM | POA: Diagnosis not present

## 2021-12-03 DIAGNOSIS — F32A Depression, unspecified: Secondary | ICD-10-CM

## 2021-12-03 LAB — POCT URINALYSIS DIP (MANUAL ENTRY)
Bilirubin, UA: NEGATIVE
Blood, UA: NEGATIVE
Glucose, UA: NEGATIVE mg/dL
Ketones, POC UA: NEGATIVE mg/dL
Leukocytes, UA: NEGATIVE
Nitrite, UA: NEGATIVE
Protein Ur, POC: NEGATIVE mg/dL
Spec Grav, UA: 1.015 (ref 1.010–1.025)
Urobilinogen, UA: 0.2 E.U./dL
pH, UA: 5 (ref 5.0–8.0)

## 2021-12-03 LAB — COMPREHENSIVE METABOLIC PANEL
ALT: 49 U/L (ref 0–53)
AST: 31 U/L (ref 0–37)
Albumin: 5.1 g/dL (ref 3.5–5.2)
Alkaline Phosphatase: 43 U/L (ref 39–117)
BUN: 12 mg/dL (ref 6–23)
CO2: 31 mEq/L (ref 19–32)
Calcium: 10.3 mg/dL (ref 8.4–10.5)
Chloride: 98 mEq/L (ref 96–112)
Creatinine, Ser: 0.81 mg/dL (ref 0.40–1.50)
GFR: 107.79 mL/min (ref >60.00)
Glucose, Bld: 91 mg/dL (ref 70–99)
Potassium: 3.9 mEq/L (ref 3.5–5.1)
Sodium: 138 mEq/L (ref 135–145)
Total Bilirubin: 0.6 mg/dL (ref 0.2–1.2)
Total Protein: 8.6 g/dL — ABNORMAL HIGH (ref 6.0–8.3)

## 2021-12-03 LAB — LIPID PANEL
Cholesterol: 261 mg/dL — ABNORMAL HIGH (ref 0–200)
HDL: 67.5 mg/dL (ref >39.00)
LDL Cholesterol: 176 mg/dL — ABNORMAL HIGH (ref 0–99)
NonHDL: 193.56
Total CHOL/HDL Ratio: 4
Triglycerides: 87 mg/dL (ref 0.0–149.0)
VLDL: 17.4 mg/dL (ref 0.0–40.0)

## 2021-12-03 MED ORDER — ROSUVASTATIN CALCIUM 40 MG PO TABS: ORAL_TABLET | ORAL | 1 refills | Status: DC

## 2021-12-03 MED ORDER — SERTRALINE HCL 25 MG PO TABS: ORAL_TABLET | ORAL | 3 refills | Status: DC

## 2021-12-03 NOTE — Progress Notes (Signed)
Subjective:  Patient ID: Brett Wright, male    DOB: Jul 10, 1980  Age: 41 y.o. MRN: [id number]  CC:  Chief Complaint  Patient presents with   Back Pain    Pt states he is having back and side pain with sleep, pt is having lower back pain, pt states it has been going on for about 2 weeks he states it feels internal   Medication Refill   Insomnia    Pt states he isnt sleeping well, pt states he wakes up after 2 or 3 hours and cant gp back to sleep and it has been going on for about 3 months   Depression    PHQ9 - 12    HPI Brett Wright presents for multiple concerns above, last visit with me 1 year ago in October 2022 to establish care.  6 month follow up recommended at that time.   Back, side pain, lower back pain Past few weeks, no known injury. Bilateral low back, past 2 weeks. Intermittent. Random times. Burning feeling. No history of kidney stones or blood in urine. No dysuria. Resolves in few hours.  No leg radiation.  No bowel or bladder incontinence, no saddle anesthesia, no lower extremity weakness.  Home treatments: advil on occasion - minimal change.   Hyperlipidemia: Treated with Crestor 20 mg - increased to 40mg  daily, last year. Has not had repeat labs. Family history of coronary disease with father having CABG in mid 37s., goal LDL discussed to at least below 100, approximately 70.  Lab Results  Component Value Date   CHOL 264 (H) 11/22/2020   HDL 65.80 11/22/2020   LDLCALC 180 (H) 11/22/2020   LDLDIRECT 176.0 06/29/2017   TRIG 87.0 11/22/2020   CHOLHDL 4 11/22/2020   Lab Results  Component Value Date   ALT 50 11/22/2020   AST 30 11/22/2020   ALKPHOS 44 11/22/2020   BILITOT 0.6 11/22/2020   Depression with anxiety Stable on sertraline 25 mg when discussed in October of last year.  Has been having some difficulty with sleep, waking up after a few hours and then difficulty returning back to sleep.  Noticed past few months. Lack of sleep seems to affect other  symptoms. Not feeling more depressed.  Sleep treatment - prior melatonin, magnesium - stopped working.  Falls asleep ok, waking up few hours later and trouble getting back to sleep - lays there.avoiding caffeine, soft drinks.  Reading at night.  Some exercise  - 3 days per week. No change in sleep. Sauna helps. Not waking up with pain, no usual snoring, no PND.       12/03/2021   11:46 AM 11/22/2020    9:31 AM 05/25/2019    8:48 AM 05/14/2017    9:03 AM 01/17/2016   10:42 AM  Depression screen PHQ 2/9  Decreased Interest 2 1 0 0 0  Down, Depressed, Hopeless 1 0 0 0 0  PHQ - 2 Score 3 1 0 0 0  Altered sleeping 3 1 0 0 0  Tired, decreased energy 3 1 0 0 0  Change in appetite 2 1 0 0 0  Feeling bad or failure about yourself  0 0 0 0 0  Trouble concentrating 1 0 0 0 0  Moving slowly or fidgety/restless 0 0 0 0 0  Suicidal thoughts 0 0 0 0 0  PHQ-9 Score 12 4 0 0 0  Difficult doing work/chores   Not difficult at all Not difficult at all  History Patient Active Problem List   Diagnosis Date Noted   Anxiety and depression 09/03/2015   Hyperlipidemia 05/17/2015   Visit for preventive health examination 02/15/2015   Past Medical History:  Diagnosis Date   Anxiety    no current issue -- many years ago   GERD (gastroesophageal reflux disease)    History of chicken pox    Past Surgical History:  Procedure Laterality Date   CLOSED REDUCTION RADIAL HEAD / NECK FRACTURE     rod, screw, pins, plates   OPEN REDUCTION INTERNAL FIXATION (ORIF) TIBIA/FIBULA FRACTURE  2008   rod & screws; [facility]   TONSILLECTOMY     WISDOM TOOTH EXTRACTION     Allergies  Allergen Reactions   Pollen Extract     Allergic rhinoconjunctivitis   Penicillins      ? Reaction as child   Prior to Admission medications   Medication Sig Start Date End Date Taking? Authorizing Provider  finasteride (PROPECIA) 1 MG tablet Take 0.5 mg by mouth. Take 0.5 tablet every 3 days for alopecia   Yes [provider]  multivitamin (THERAGRAN) per tablet Take 1 tablet by mouth daily.     Yes [provider]  rosuvastatin (CRESTOR) 40 MG tablet TAKE 1 TABLET(40 MG) BY MOUTH DAILY 06/04/21  Yes Shade FloodGreene, Azalie Harbeck R, MD  sertraline (ZOLOFT) 25 MG tablet TAKE 1 TABLET(25 MG) BY MOUTH DAILY 11/22/20  Yes Shade FloodGreene, Avyana Puffenbarger R, MD  fluticasone (FLONASE) 50 MCG/ACT nasal spray Place 2 sprays into both nostrils daily. Patient not taking: Reported on 12/03/2021 05/14/17   Waldon MerlMartin, William C, PA-C  Melatonin 3 MG TABS Take 1 tablet by mouth at bedtime. Patient not taking: Reported on 12/03/2021    [provider]   Social History   Socioeconomic History   Marital status: Single    Spouse name: Not on file   Number of children: Not on file   Years of education: Not on file   Highest education level: Not on file  Occupational History   Occupation: financial advisor  Tobacco Use   Smoking status: Former    Types: Cigarettes    Quit date: 02/10/1999    Years since quitting: 22.8   Smokeless tobacco: Never   Tobacco comments:    smoked 1996-2001 , 1 pp MONTH  Vaping Use   Vaping Use: Never used  Substance and Sexual Activity   Alcohol use: Yes    Alcohol/week: 0.0 standard drinks of alcohol    Comment: SOCIALLY   Drug use: No   Sexual activity: Yes    Partners: Female    Comment: wife  Other Topics Concern   Not on file  Social History Narrative   Regular Exercise-3x wkly      Social Determinants of Health   Financial Resource Strain: Not on file  Food Insecurity: Not on file  Transportation Needs: Not on file  Physical Activity: Not on file  Stress: Not on file  Social Connections: Not on file  Intimate Partner Violence: Not on file    Review of Systems Per HPI  Objective:   Vitals:   12/03/21 1147  BP: 128/88  Pulse: (!) 59  Temp: 98.5 F (36.9 C)  SpO2: 100%  Weight: 177 lb 3.2 oz (80.4 kg)  Height: 5\' 6"  (1.676 m)     Physical Exam Vitals reviewed.   Constitutional:      Appearance: He is well-developed.  HENT:     Head: Normocephalic and atraumatic.  Neck:  Vascular: No carotid bruit or JVD.  Cardiovascular:     Rate and Rhythm: Normal rate and regular rhythm.     Heart sounds: Normal heart sounds. No murmur heard. Pulmonary:     Effort: Pulmonary effort is normal.     Breath sounds: Normal breath sounds. No rales.  Abdominal:     General: Abdomen is flat. Bowel sounds are normal. There is no distension.     Tenderness: There is no abdominal tenderness.  Musculoskeletal:     Right lower leg: No edema.     Left lower leg: No edema.     Comments: Lumbar spine nontender, no CVA tenderness.  Skin:    General: Skin is warm and dry.  Neurological:     Mental Status: He is alert and oriented to person, place, and time.  Psychiatric:        Mood and Affect: Mood normal.        Behavior: Behavior normal.       Assessment & Plan:  Brett Wright is a 41 y.o. male . Insomnia, unspecified type Anxiety and depression - Plan: sertraline (ZOLOFT) 25 MG tablet  -Reports overall stable anxiety/depression but question affect on insomnia, difficulty getting back to sleep.  Sleep hygiene discussed, including reading if he is awake, and making list of concerns to review next day.  Option of temporary Tylenol PM if needed.  Recheck next few weeks.  Consider higher dose sertraline if persistent.  Hyperlipidemia, unspecified hyperlipidemia type - Plan: rosuvastatin (CRESTOR) 40 MG tablet, Comprehensive metabolic panel, Lipid panel  -Tolerating current dose Crestor, significant elevated LDL previously.  May need to meet with lipid clinic, discussed PCSK9 inhibitor given family history of CAD.  Check labs.  Acute bilateral low back pain without sciatica - Plan: POCT urinalysis dipstick  -Urinalysis without blood.  Intermittent symptoms.  Question muscular pain.  Handout given.  RTC precautions if persistent or worsening.  Meds ordered this  encounter  Medications   rosuvastatin (CRESTOR) 40 MG tablet    Sig: TAKE 1 TABLET(40 MG) BY MOUTH DAILY    Dispense:  90 tablet    Refill:  1    ZERO refills remain on this prescription. Your patient is requesting advance approval of refills for this medication to PREVENT ANY MISSED DOSES   sertraline (ZOLOFT) 25 MG tablet    Sig: TAKE 1 TABLET(25 MG) BY MOUTH DAILY    Dispense:  90 tablet    Refill:  3   Patient Instructions  I will check some labs for back pain. If not improving with antiinflammatory in next week or two, may need to check imaging.   Depending on cholesterol readings, may need to refer you to lipid specialist.   Tylenol pm is an option temporarily for sleep.  When waking up at night. If unable to get to sleep in 30 mins, get up and read until tired again and get in bed. Try making a list as we discussed.  Recheck in next 3 weeks.  Let me know if there are questions sooner.   Insomnia Insomnia is a sleep disorder that makes it difficult to fall asleep or stay asleep. Insomnia can cause fatigue, low energy, difficulty concentrating, mood swings, and poor performance at work or school. There are three different ways to classify insomnia: Difficulty falling asleep. Difficulty staying asleep. Waking up too early in the morning. Any type of insomnia can be long-term (chronic) or short-term (acute). Both are common. Short-term insomnia usually lasts for  3 months or less. Chronic insomnia occurs at least three times a week for longer than 3 months. What are the causes? Insomnia may be caused by another condition, situation, or substance, such as: Having certain mental health conditions, such as anxiety and depression. Using caffeine, alcohol, tobacco, or drugs. Having gastrointestinal conditions, such as gastroesophageal reflux disease (GERD). Having certain medical conditions. These include: Asthma. Alzheimer's disease. Stroke. Chronic pain. An overactive thyroid  gland (hyperthyroidism). Other sleep disorders, such as restless legs syndrome and sleep apnea. Menopause. Sometimes, the cause of insomnia may not be known. What increases the risk? Risk factors for insomnia include: Gender. Females are affected more often than males. Age. Insomnia is more common as people get older. Stress and certain medical and mental health conditions. Lack of exercise. Having an irregular work schedule. This may include working night shifts and traveling between different time zones. What are the signs or symptoms? If you have insomnia, the main symptom is having trouble falling asleep or having trouble staying asleep. This may lead to other symptoms, such as: Feeling tired or having low energy. Feeling nervous about going to sleep. Not feeling rested in the morning. Having trouble concentrating. Feeling irritable, anxious, or depressed. How is this diagnosed? This condition may be diagnosed based on: Your symptoms and medical history. Your health care provider may ask about: Your sleep habits. Any medical conditions you have. Your mental health. A physical exam. How is this treated? Treatment for insomnia depends on the cause. Treatment may focus on treating an underlying condition that is causing the insomnia. Treatment may also include: Medicines to help you sleep. Counseling or therapy. Lifestyle adjustments to help you sleep better. Follow these instructions at home: Eating and drinking  Limit or avoid alcohol, caffeinated beverages, and products that contain nicotine and tobacco, especially close to bedtime. These can disrupt your sleep. Do not eat a large meal or eat spicy foods right before bedtime. This can lead to digestive discomfort that can make it hard for you to sleep. Sleep habits  Keep a sleep diary to help you and your health care provider figure out what could be causing your insomnia. Write down: When you sleep. When you wake up during  the night. How well you sleep and how rested you feel the next day. Any side effects of medicines you are taking. What you eat and drink. Make your bedroom a dark, comfortable place where it is easy to fall asleep. Put up shades or blackout curtains to block light from outside. Use a white noise machine to block noise. Keep the temperature cool. Limit screen use before bedtime. This includes: Not watching TV. Not using your smartphone, tablet, or computer. Stick to a routine that includes going to bed and waking up at the same times every day and night. This can help you fall asleep faster. Consider making a quiet activity, such as reading, part of your nighttime routine. Try to avoid taking naps during the day so that you sleep better at night. Get out of bed if you are still awake after 15 minutes of trying to sleep. Keep the lights down, but try reading or doing a quiet activity. When you feel sleepy, go back to bed. General instructions Take over-the-counter and prescription medicines only as told by your health care provider. Exercise regularly as told by your health care provider. However, avoid exercising in the hours right before bedtime. Use relaxation techniques to manage stress. Ask your health care provider to suggest  some techniques that may work well for you. These may include: Breathing exercises. Routines to release muscle tension. Visualizing peaceful scenes. Make sure that you drive carefully. Do not drive if you feel very sleepy. Keep all follow-up visits. This is important. Contact a health care provider if: You are tired throughout the day. You have trouble in your daily routine due to sleepiness. You continue to have sleep problems, or your sleep problems get worse. Get help right away if: You have thoughts about hurting yourself or someone else. Get help right away if you feel like you may hurt yourself or others, or have thoughts about taking your own life. Go to  your nearest emergency room or: Call 911. Call the [facility] at [phone] or 988. This is open 24 hours a day. Text the Crisis Text Line at 218-473-9401. Summary Insomnia is a sleep disorder that makes it difficult to fall asleep or stay asleep. Insomnia can be long-term (chronic) or short-term (acute). Treatment for insomnia depends on the cause. Treatment may focus on treating an underlying condition that is causing the insomnia. Keep a sleep diary to help you and your health care provider figure out what could be causing your insomnia. This information is not intended to replace advice given to you by your health care provider. Make sure you discuss any questions you have with your health care provider. Document Revised: 01/06/2021 Document Reviewed: 01/06/2021 Elsevier Patient Education  [place].    Acute Back Pain, Adult Acute back pain is sudden and usually short-lived. It is often caused by an injury to the muscles and tissues in the back. The injury may result from: A muscle, tendon, or ligament getting overstretched or torn. Ligaments are tissues that connect bones to each other. Lifting something improperly can cause a back strain. Wear and tear (degeneration) of the spinal disks. Spinal disks are circular tissue that provide cushioning between the bones of the spine (vertebrae). Twisting motions, such as while playing sports or doing yard work. A hit to the back. Arthritis. You may have a physical exam, lab tests, and imaging tests to find the cause of your pain. Acute back pain usually goes away with rest and home care. Follow these instructions at home: Managing pain, stiffness, and swelling Take over-the-counter and prescription medicines only as told by your health care provider. Treatment may include medicines for pain and inflammation that are taken by mouth or applied to the skin, or muscle relaxants. Your health care provider may  recommend applying ice during the first 24-48 hours after your pain starts. To do this: Put ice in a plastic bag. Place a towel between your skin and the bag. Leave the ice on for 20 minutes, 2-3 times a day. Remove the ice if your skin turns bright red. This is very important. If you cannot feel pain, heat, or cold, you have a greater risk of damage to the area. If directed, apply heat to the affected area as often as told by your health care provider. Use the heat source that your health care provider recommends, such as a moist heat pack or a heating pad. Place a towel between your skin and the heat source. Leave the heat on for 20-30 minutes. Remove the heat if your skin turns bright red. This is especially important if you are unable to feel pain, heat, or cold. You have a greater risk of getting burned. Activity  Do not stay in bed. Staying in bed for  more than 1-2 days can delay your recovery. Sit up and stand up straight. Avoid leaning forward when you sit or hunching over when you stand. If you work at a desk, sit close to it so you do not need to lean over. Keep your chin tucked in. Keep your neck drawn back, and keep your elbows bent at a 90-degree angle (right angle). Sit high and close to the steering wheel when you drive. Add lower back (lumbar) support to your car seat, if needed. Take short walks on even surfaces as soon as you are able. Try to increase the length of time you walk each day. Do not sit, drive, or stand in one place for more than 30 minutes at a time. Sitting or standing for long periods of time can put stress on your back. Do not drive or use heavy machinery while taking prescription pain medicine. Use proper lifting techniques. When you bend and lift, use positions that put less stress on your back: [place] your knees. Keep the load close to your body. Avoid twisting. Exercise regularly as told by your health care provider. Exercising helps your back heal faster  and helps prevent back injuries by keeping muscles strong and flexible. Work with a physical therapist to make a safe exercise program, as recommended by your health care provider. Do any exercises as told by your physical therapist. Lifestyle Maintain a healthy weight. Extra weight puts stress on your back and makes it difficult to have good posture. Avoid activities or situations that make you feel anxious or stressed. Stress and anxiety increase muscle tension and can make back pain worse. Learn ways to manage anxiety and stress, such as through exercise. General instructions Sleep on a firm mattress in a comfortable position. Try lying on your side with your knees slightly bent. If you lie on your back, put a pillow under your knees. Keep your head and neck in a straight line with your spine (neutral position) when using electronic equipment like smartphones or pads. To do this: Raise your smartphone or pad to look at it instead of bending your head or neck to look down. Put the smartphone or pad at the level of your face while looking at the screen. Follow your treatment plan as told by your health care provider. This may include: Cognitive or behavioral therapy. Acupuncture or massage therapy. Meditation or yoga. Contact a health care provider if: You have pain that is not relieved with rest or medicine. You have increasing pain going down into your legs or buttocks. Your pain does not improve after 2 weeks. You have pain at night. You lose weight without trying. You have a fever or chills. You develop nausea or vomiting. You develop abdominal pain. Get help right away if: You develop new bowel or bladder control problems. You have unusual weakness or numbness in your arms or legs. You feel faint. These symptoms may represent a serious problem that is an emergency. Do not wait to see if the symptoms will go away. Get medical help right away. Call your local emergency services (911 in  the [place] Do not drive yourself to the hospital. Summary Acute back pain is sudden and usually short-lived. Use proper lifting techniques. When you bend and lift, use positions that put less stress on your back. Take over-the-counter and prescription medicines only as told by your health care provider, and apply heat or ice as told. This information is not intended to replace advice given to you by  your health care provider. Make sure you discuss any questions you have with your health care provider. Document Revised: 04/19/2020 Document Reviewed: 04/19/2020 Elsevier Patient Education  2023 [employer]      Signed,   Meredith Staggers, MD [facility], [facility] 12/03/21 12:02 PM

## 2021-12-03 NOTE — Patient Instructions (Addendum)
I will check some labs for back pain. If not improving with antiinflammatory in next week or two, may need to check imaging.   Depending on cholesterol readings, may need to refer you to lipid specialist.   Tylenol pm is an option temporarily for sleep.  When waking up at night. If unable to get to sleep in 30 mins, get up and read until tired again and get in bed. Try making a list as we discussed.  Recheck in next 3 weeks.  Let me know if there are questions sooner.   Insomnia Insomnia is a sleep disorder that makes it difficult to fall asleep or stay asleep. Insomnia can cause fatigue, low energy, difficulty concentrating, mood swings, and poor performance at work or school. There are three different ways to classify insomnia: Difficulty falling asleep. Difficulty staying asleep. Waking up too early in the morning. Any type of insomnia can be long-term (chronic) or short-term (acute). Both are common. Short-term insomnia usually lasts for 3 months or less. Chronic insomnia occurs at least three times a week for longer than 3 months. What are the causes? Insomnia may be caused by another condition, situation, or substance, such as: Having certain mental health conditions, such as anxiety and depression. Using caffeine, alcohol, tobacco, or drugs. Having gastrointestinal conditions, such as gastroesophageal reflux disease (GERD). Having certain medical conditions. These include: Asthma. Alzheimer's disease. Stroke. Chronic pain. An overactive thyroid gland (hyperthyroidism). Other sleep disorders, such as restless legs syndrome and sleep apnea. Menopause. Sometimes, the cause of insomnia may not be known. What increases the risk? Risk factors for insomnia include: Gender. Females are affected more often than males. Age. Insomnia is more common as people get older. Stress and certain medical and mental health conditions. Lack of exercise. Having an irregular work schedule. This  may include working night shifts and traveling between different time zones. What are the signs or symptoms? If you have insomnia, the main symptom is having trouble falling asleep or having trouble staying asleep. This may lead to other symptoms, such as: Feeling tired or having low energy. Feeling nervous about going to sleep. Not feeling rested in the morning. Having trouble concentrating. Feeling irritable, anxious, or depressed. How is this diagnosed? This condition may be diagnosed based on: Your symptoms and medical history. Your health care provider may ask about: Your sleep habits. Any medical conditions you have. Your mental health. A physical exam. How is this treated? Treatment for insomnia depends on the cause. Treatment may focus on treating an underlying condition that is causing the insomnia. Treatment may also include: Medicines to help you sleep. Counseling or therapy. Lifestyle adjustments to help you sleep better. Follow these instructions at home: Eating and drinking  Limit or avoid alcohol, caffeinated beverages, and products that contain nicotine and tobacco, especially close to bedtime. These can disrupt your sleep. Do not eat a large meal or eat spicy foods right before bedtime. This can lead to digestive discomfort that can make it hard for you to sleep. Sleep habits  Keep a sleep diary to help you and your health care provider figure out what could be causing your insomnia. Write down: When you sleep. When you wake up during the night. How well you sleep and how rested you feel the next day. Any side effects of medicines you are taking. What you eat and drink. Make your bedroom a dark, comfortable place where it is easy to fall asleep. Put up shades or blackout curtains to block  light from outside. Use a white noise machine to block noise. Keep the temperature cool. Limit screen use before bedtime. This includes: Not watching TV. Not using your  smartphone, tablet, or computer. Stick to a routine that includes going to bed and waking up at the same times every day and night. This can help you fall asleep faster. Consider making a quiet activity, such as reading, part of your nighttime routine. Try to avoid taking naps during the day so that you sleep better at night. Get out of bed if you are still awake after 15 minutes of trying to sleep. Keep the lights down, but try reading or doing a quiet activity. When you feel sleepy, go back to bed. General instructions Take over-the-counter and prescription medicines only as told by your health care provider. Exercise regularly as told by your health care provider. However, avoid exercising in the hours right before bedtime. Use relaxation techniques to manage stress. Ask your health care provider to suggest some techniques that may work well for you. These may include: Breathing exercises. Routines to release muscle tension. Visualizing peaceful scenes. Make sure that you drive carefully. Do not drive if you feel very sleepy. Keep all follow-up visits. This is important. Contact a health care provider if: You are tired throughout the day. You have trouble in your daily routine due to sleepiness. You continue to have sleep problems, or your sleep problems get worse. Get help right away if: You have thoughts about hurting yourself or someone else. Get help right away if you feel like you may hurt yourself or others, or have thoughts about taking your own life. Go to your nearest emergency room or: Call 911. Call the National Suicide Prevention Lifeline at 985-623-3170 or 988. This is open 24 hours a day. Text the Crisis Text Line at 610-212-7845. Summary Insomnia is a sleep disorder that makes it difficult to fall asleep or stay asleep. Insomnia can be long-term (chronic) or short-term (acute). Treatment for insomnia depends on the cause. Treatment may focus on treating an underlying condition  that is causing the insomnia. Keep a sleep diary to help you and your health care provider figure out what could be causing your insomnia. This information is not intended to replace advice given to you by your health care provider. Make sure you discuss any questions you have with your health care provider. Document Revised: 01/06/2021 Document Reviewed: 01/06/2021 Elsevier Patient Education  2023 Elsevier Inc.    Acute Back Pain, Adult Acute back pain is sudden and usually short-lived. It is often caused by an injury to the muscles and tissues in the back. The injury may result from: A muscle, tendon, or ligament getting overstretched or torn. Ligaments are tissues that connect bones to each other. Lifting something improperly can cause a back strain. Wear and tear (degeneration) of the spinal disks. Spinal disks are circular tissue that provide cushioning between the bones of the spine (vertebrae). Twisting motions, such as while playing sports or doing yard work. A hit to the back. Arthritis. You may have a physical exam, lab tests, and imaging tests to find the cause of your pain. Acute back pain usually goes away with rest and home care. Follow these instructions at home: Managing pain, stiffness, and swelling Take over-the-counter and prescription medicines only as told by your health care provider. Treatment may include medicines for pain and inflammation that are taken by mouth or applied to the skin, or muscle relaxants. Your health care provider  may recommend applying ice during the first 24-48 hours after your pain starts. To do this: Put ice in a plastic bag. Place a towel between your skin and the bag. Leave the ice on for 20 minutes, 2-3 times a day. Remove the ice if your skin turns bright red. This is very important. If you cannot feel pain, heat, or cold, you have a greater risk of damage to the area. If directed, apply heat to the affected area as often as told by your  health care provider. Use the heat source that your health care provider recommends, such as a moist heat pack or a heating pad. Place a towel between your skin and the heat source. Leave the heat on for 20-30 minutes. Remove the heat if your skin turns bright red. This is especially important if you are unable to feel pain, heat, or cold. You have a greater risk of getting burned. Activity  Do not stay in bed. Staying in bed for more than 1-2 days can delay your recovery. Sit up and stand up straight. Avoid leaning forward when you sit or hunching over when you stand. If you work at a desk, sit close to it so you do not need to lean over. Keep your chin tucked in. Keep your neck drawn back, and keep your elbows bent at a 90-degree angle (right angle). Sit high and close to the steering wheel when you drive. Add lower back (lumbar) support to your car seat, if needed. Take short walks on even surfaces as soon as you are able. Try to increase the length of time you walk each day. Do not sit, drive, or stand in one place for more than 30 minutes at a time. Sitting or standing for long periods of time can put stress on your back. Do not drive or use heavy machinery while taking prescription pain medicine. Use proper lifting techniques. When you bend and lift, use positions that put less stress on your back: [place] your knees. Keep the load close to your body. Avoid twisting. Exercise regularly as told by your health care provider. Exercising helps your back heal faster and helps prevent back injuries by keeping muscles strong and flexible. Work with a physical therapist to make a safe exercise program, as recommended by your health care provider. Do any exercises as told by your physical therapist. Lifestyle Maintain a healthy weight. Extra weight puts stress on your back and makes it difficult to have good posture. Avoid activities or situations that make you feel anxious or stressed. Stress and  anxiety increase muscle tension and can make back pain worse. Learn ways to manage anxiety and stress, such as through exercise. General instructions Sleep on a firm mattress in a comfortable position. Try lying on your side with your knees slightly bent. If you lie on your back, put a pillow under your knees. Keep your head and neck in a straight line with your spine (neutral position) when using electronic equipment like smartphones or pads. To do this: Raise your smartphone or pad to look at it instead of bending your head or neck to look down. Put the smartphone or pad at the level of your face while looking at the screen. Follow your treatment plan as told by your health care provider. This may include: Cognitive or behavioral therapy. Acupuncture or massage therapy. Meditation or yoga. Contact a health care provider if: You have pain that is not relieved with rest or medicine. You have increasing pain  going down into your legs or buttocks. Your pain does not improve after 2 weeks. You have pain at night. You lose weight without trying. You have a fever or chills. You develop nausea or vomiting. You develop abdominal pain. Get help right away if: You develop new bowel or bladder control problems. You have unusual weakness or numbness in your arms or legs. You feel faint. These symptoms may represent a serious problem that is an emergency. Do not wait to see if the symptoms will go away. Get medical help right away. Call your local emergency services (911 in the [place] Do not drive yourself to the hospital. Summary Acute back pain is sudden and usually short-lived. Use proper lifting techniques. When you bend and lift, use positions that put less stress on your back. Take over-the-counter and prescription medicines only as told by your health care provider, and apply heat or ice as told. This information is not intended to replace advice given to you by your health care provider. Make  sure you discuss any questions you have with your health care provider. Document Revised: 04/19/2020 Document Reviewed: 04/19/2020 Elsevier Patient Education  2023 [employer].

## 2021-12-13 ENCOUNTER — Encounter: Payer: Self-pay | Admitting: Family Medicine

## 2021-12-13 DIAGNOSIS — E785 Hyperlipidemia, unspecified: Secondary | ICD-10-CM

## 2021-12-15 NOTE — Telephone Encounter (Signed)
Patient reports okay to place that referral to whomever you recommend

## 2022-02-06 ENCOUNTER — Encounter: Payer: Self-pay | Admitting: Family Medicine

## 2022-04-27 ENCOUNTER — Other Ambulatory Visit: Payer: Self-pay | Admitting: Family Medicine

## 2022-04-27 DIAGNOSIS — F419 Anxiety disorder, unspecified: Secondary | ICD-10-CM

## 2022-04-28 ENCOUNTER — Other Ambulatory Visit: Payer: Self-pay

## 2022-04-28 DIAGNOSIS — F419 Anxiety disorder, unspecified: Secondary | ICD-10-CM

## 2022-04-28 MED ORDER — SERTRALINE HCL 25 MG PO TABS: ORAL_TABLET | ORAL | 3 refills | Status: DC

## 2022-04-28 NOTE — Telephone Encounter (Signed)
Refilled Sertraline (zoloft) 25mg .  Patient has upcoming appt. 06/2023

## 2022-06-03 ENCOUNTER — Ambulatory Visit (HOSPITAL_BASED_OUTPATIENT_CLINIC_OR_DEPARTMENT_OTHER): Payer: Managed Care, Other (non HMO) | Admitting: Internal Medicine

## 2022-06-03 ENCOUNTER — Telehealth: Payer: Self-pay | Admitting: Internal Medicine

## 2022-06-03 ENCOUNTER — Encounter (HOSPITAL_BASED_OUTPATIENT_CLINIC_OR_DEPARTMENT_OTHER): Payer: Self-pay | Admitting: Internal Medicine

## 2022-06-03 VITALS — BP 149/97 | HR 73 | Ht 66.0 in | Wt 169.5 lb

## 2022-06-03 DIAGNOSIS — E7801 Familial hypercholesterolemia: Secondary | ICD-10-CM

## 2022-06-03 DIAGNOSIS — Z8249 Family history of ischemic heart disease and other diseases of the circulatory system: Secondary | ICD-10-CM | POA: Diagnosis not present

## 2022-06-03 NOTE — Telephone Encounter (Signed)
Genetic test for dyslipidemia/ASCVD ordered ([facility]) Cheek swab completed in office Specimen and necessary paperwork mailed. ID: [id number]

## 2022-06-03 NOTE — Patient Instructions (Signed)
Medication Instructions:  NO CHANGES today   *If you need a refill on your cardiac medications before your next appointment, please call your pharmacy*   Lab Work: FASTING NMR lipoprofile and LPa as soon as able   If you have labs (blood work) drawn today and your tests are completely normal, you will receive your results only by: MyChart Message (if you have MyChart) OR A paper copy in the mail If you have any lab test that is abnormal or we need to change your treatment, we will call you to review the results.   Testing/Procedures:  Genetic Test -- results available in about 3 weeks. Dr. Rennis Golden will reach out w/results  Dr. Rennis Golden has ordered a CT coronary calcium score.   Test locations:  [facility]   This is $99 out of pocket.   Coronary CalciumScan A coronary calcium scan is an imaging test used to look for deposits of calcium and other fatty materials (plaques) in the inner lining of the blood vessels of the heart (coronary arteries). These deposits of calcium and plaques can partly clog and narrow the coronary arteries without producing any symptoms or warning signs. This puts a person at risk for a heart attack. This test can detect these deposits before symptoms develop. Tell a health care provider about: Any allergies you have. All medicines you are taking, including vitamins, herbs, eye drops, creams, and over-the-counter medicines. Any problems you or family members have had with anesthetic medicines. Any blood disorders you have. Any surgeries you have had. Any medical conditions you have. Whether you are pregnant or may be pregnant. What are the risks? Generally, this is a safe procedure. However, problems may occur, including: Harm to a pregnant woman and her unborn baby. This test involves the use of radiation. Radiation exposure can be dangerous to a pregnant woman and her unborn baby. If you are pregnant, you generally should not  have this procedure done. Slight increase in the risk of cancer. This is because of the radiation involved in the test. What happens before the procedure? No preparation is needed for this procedure. What happens during the procedure? You will undress and remove any jewelry around your neck or chest. You will put on a hospital gown. Sticky electrodes will be placed on your chest. The electrodes will be connected to an electrocardiogram (ECG) machine to record a tracing of the electrical activity of your heart. A CT scanner will take pictures of your heart. During this time, you will be asked to lie still and hold your breath for 2-3 seconds while a picture of your heart is being taken. The procedure may vary among health care providers and hospitals. What happens after the procedure? You can get dressed. You can return to your normal activities. It is up to you to get the results of your test. Ask your health care provider, or the department that is doing the test, when your results will be ready. Summary A coronary calcium scan is an imaging test used to look for deposits of calcium and other fatty materials (plaques) in the inner lining of the blood vessels of the heart (coronary arteries). Generally, this is a safe procedure. Tell your health care provider if you are pregnant or may be pregnant. No preparation is needed for this procedure. A CT scanner will take pictures of your heart. You can return to your normal activities after the scan is done. This information is not intended to replace advice  given to you by your health care provider. Make sure you discuss any questions you have with your health care provider. Document Released: 07/25/2007 Document Revised: 12/16/2015 Document Reviewed: 12/16/2015 Elsevier Interactive Patient Education  2017 [employer].    Follow-Up: At [facility], you and your health needs are our priority.  As part of our continuing mission to  provide you with exceptional heart care, we have created designated Provider Care Teams.  These Care Teams include your primary Cardiologist (physician) and Advanced Practice Providers (APPs -  Physician Assistants and Nurse Practitioners) who all work together to provide you with the care you need, when you need it.  We recommend signing up for the patient portal called "MyChart".  Sign up information is provided on this After Visit Summary.  MyChart is used to connect with patients for Virtual Visits (Telemedicine).  Patients are able to view lab/test results, encounter notes, upcoming appointments, etc.  Non-urgent messages can be sent to your provider as well.   To learn more about what you can do with MyChart, go to [email or web address].    Your next appointment:    4 months with Dr. Rennis Golden

## 2022-06-03 NOTE — Progress Notes (Signed)
LIPID CLINIC CONSULT NOTE  Chief Complaint:  Manage dyslipidemia  Primary Care Physician: Shade Flood, MD  Primary Cardiologist:  None  HPI:  Brett Wright is a 42 y.o. male who is being seen today for the evaluation of dyslipidemia at the request of Shade Flood, MD. this is a pleasant 42 year old male kindly referred for evaluation management of dyslipidemia.  Brett Wright reports a longstanding history of high cholesterol and more recently he has been on therapy for this with 20 mg rosuvastatin daily.  He felt like he had a number of side effects with the medication including sexual side effects, myalgias and fatigue.  His cholesterol had actually gone up somewhat and it was recommended that he increase the dose of the medication however ultimately he stopped the medication and noted improvement in some of the side effects.  He then wished to be seen here for further evaluation as to what the possible causes are of his elevated cholesterol and to better understand his risk and what would be the best treatment options for him.  He tells me that there is heart disease in his family including his father who had single-vessel bypass in his mid 60s.  Recent lipid testing for him 6 months ago showed total cholesterol 261, triglycerides 87, HDL 67 and LDL 176.  His cholesterol 3 years ago showed total of 320 with LDL 233.  This is highly suggestive of a familial hyperlipidemia.  He reports his diet is fairly healthy with low in saturated fats and generally he does exercise and is pretty active.  PMHx:  Past Medical History:  Diagnosis Date   Anxiety    no current issue -- many years ago   GERD (gastroesophageal reflux disease)    History of chicken pox     Past Surgical History:  Procedure Laterality Date   CLOSED REDUCTION RADIAL HEAD / NECK FRACTURE     rod, screw, pins, plates   OPEN REDUCTION INTERNAL FIXATION (ORIF) TIBIA/FIBULA FRACTURE  2008   rod & screws; [facility]    TONSILLECTOMY     WISDOM TOOTH EXTRACTION      FAMHx:  Family History  Problem Relation Age of Onset   Hypertension Father        Lving   Anxiety disorder Father        stress   Heart disease Father    Thyroid disease Mother        Living   Heart attack Maternal Grandmother        in 69s   Thyroid disease Maternal Grandmother    Lung cancer Paternal Uncle        SMOKER   Heart disease Other        Paternal Side   Healthy Son        x2   Diabetes Neg Hx    Stroke Neg Hx     SOCHx:   reports that he quit smoking about 23 years ago. His smoking use included cigarettes. He has never used smokeless tobacco. He reports current alcohol use. He reports that he does not use drugs.  ALLERGIES:  Allergies  Allergen Reactions   Pollen Extract     Allergic rhinoconjunctivitis   Crestor [Rosuvastatin]     Myalgias, Fatigue   Penicillins      ? Reaction as child    ROS: Pertinent items noted in HPI and remainder of comprehensive ROS otherwise negative.  HOME MEDS: Current Outpatient Medications on File Prior  to Visit  Medication Sig Dispense Refill   Coenzyme Q10 (CO Q-10 PO) Take 1 capsule by mouth daily.     Multiple Vitamin (MULTIVITAMIN) capsule Take 1 tablet by mouth daily.     sertraline (ZOLOFT) 25 MG tablet TAKE 1 TABLET(25 MG) BY MOUTH DAILY 90 tablet 3   rosuvastatin (CRESTOR) 40 MG tablet TAKE 1 TABLET(40 MG) BY MOUTH DAILY (Patient not taking: Reported on 06/03/2022) 90 tablet 1   No current facility-administered medications on file prior to visit.    LABS/IMAGING: No results found for this or any previous visit (from the past 48 hour(s)). No results found.  LIPID PANEL:    Component Value Date/Time   CHOL 261 (H) 12/03/2021 1234   TRIG 87.0 12/03/2021 1234   HDL 67.50 12/03/2021 1234   CHOLHDL 4 12/03/2021 1234   VLDL 17.4 12/03/2021 1234   LDLCALC 176 (H) 12/03/2021 1234   LDLDIRECT 176.0 06/29/2017 0732    WEIGHTS: Wt Readings from Last 3  Encounters:  06/03/22 169 lb 8 oz (76.9 kg)  12/03/21 177 lb 3.2 oz (80.4 kg)  11/22/20 176 lb 9.6 oz (80.1 kg)    VITALS: BP (!) 149/97 (BP Location: Right Arm, Patient Position: Sitting, Cuff Size: Large)   Pulse 73   Ht  (1.676 m)   Wt 169 lb 8 oz (76.9 kg)   BMI 27.36 kg/m   EXAM: Deferred  EKG: Deferred  ASSESSMENT: Probable familial hyperlipidemia, LDL greater than 190, by Tedra Coupe criteria Father with early onset coronary artery disease, status post CABG x 1 Intolerance to rosuvastatin  PLAN: 1.   Brett Wright has a very high cholesterol and a history of high cholesterol with a total over 300 in the past.  He has recently been on rosuvastatin with less than expected reduction in his cholesterol but also he had side effects with this medication.  This could be explained by high LP(a).  I would like to check that as well as a lipid NMR since his last labs were 6 months ago.  In addition to further risk ratified and will obtain a coronary calcium score and proceed with genetic testing today.  He understands that the [facility] genetic test is a send out that takes 2 to 3 weeks to return.  Insurance is likely to cover this because of his FH diagnosis, however if not the maximum out-of-pocket cost would be $299.  I will provide an explanation of the genetic findings to him when I receive it.  Based on these findings, will consider additional treatment options for him.  Since he is only tried 1 statin, he will likely need to try another statin but also may be a good candidate for PCSK9 inhibitor.  Plan follow-up with me ultimately if we do decide on a therapeutic plan in about 4 months.  Thanks again for this interesting referral.  Chrystie Nose, MD, [facility]  [place]  [facility]  Medical Director of the Advanced Lipid Disorders &  Cardiovascular Risk Reduction Clinic Diplomate of the American Board of Clinical Lipidology Attending Cardiologist  Direct Dial:  [phone]  Fax: [phone]  Website:  [email or web address]  Lisette Abu Zakya Halabi 06/03/2022, 2:27 PM

## 2022-06-10 ENCOUNTER — Other Ambulatory Visit (HOSPITAL_BASED_OUTPATIENT_CLINIC_OR_DEPARTMENT_OTHER): Payer: Managed Care, Other (non HMO)

## 2022-06-12 ENCOUNTER — Telehealth: Payer: Self-pay | Admitting: Internal Medicine

## 2022-06-12 NOTE — Telephone Encounter (Signed)
Pt states he received a letter that insurance needs OV notes on why ct cardiac score was ordered in order to finish auth. Please advise.

## 2022-06-18 ENCOUNTER — Ambulatory Visit (HOSPITAL_BASED_OUTPATIENT_CLINIC_OR_DEPARTMENT_OTHER)
Admission: RE | Admit: 2022-06-18 | Discharge: 2022-06-18 | Disposition: A | Discharge status: Home / Self Care | Payer: Managed Care, Other (non HMO) | Source: Ambulatory Visit | Attending: Internal Medicine | Admitting: Internal Medicine

## 2022-06-18 DIAGNOSIS — E7801 Familial hypercholesterolemia: Secondary | ICD-10-CM | POA: Insufficient documentation

## 2022-06-19 LAB — NMR, LIPOPROFILE
Cholesterol, Total: 353 mg/dL — ABNORMAL HIGH (ref 100–199)
HDL Particle Number: 40 umol/L (ref >=30.5)
HDL-C: 71 mg/dL (ref >39)
LDL Particle Number: 2852 nmol/L — ABNORMAL HIGH (ref <1000)
LDL Size: 21.4 nm (ref >20.5)
LDL-C (NIH Calc): 262 mg/dL — ABNORMAL HIGH (ref 0–99)
LP-IR Score: 39 (ref <=45)
Small LDL Particle Number: 1119 nmol/L — ABNORMAL HIGH (ref <=527)
Triglycerides: 115 mg/dL (ref 0–149)

## 2022-06-19 LAB — LIPOPROTEIN A (LPA): Lipoprotein (a): 17.9 nmol/L (ref <75.0)

## 2022-06-24 ENCOUNTER — Institutional Professional Consult (permissible substitution) (HOSPITAL_BASED_OUTPATIENT_CLINIC_OR_DEPARTMENT_OTHER): Payer: Managed Care, Other (non HMO) | Admitting: Internal Medicine

## 2022-06-26 ENCOUNTER — Other Ambulatory Visit: Payer: Self-pay | Admitting: Internal Medicine

## 2022-06-26 ENCOUNTER — Encounter (HOSPITAL_BASED_OUTPATIENT_CLINIC_OR_DEPARTMENT_OTHER): Payer: Self-pay | Admitting: Internal Medicine

## 2022-06-26 DIAGNOSIS — E7801 Familial hypercholesterolemia: Secondary | ICD-10-CM

## 2022-06-26 MED ORDER — ATORVASTATIN CALCIUM 40 MG PO TABS: 40.0000 mg | ORAL_TABLET | Freq: Every day | ORAL | 3 refills | Status: DC

## 2022-06-30 ENCOUNTER — Other Ambulatory Visit: Payer: Self-pay | Admitting: *Deleted

## 2022-06-30 DIAGNOSIS — E7801 Familial hypercholesterolemia: Secondary | ICD-10-CM

## 2022-07-13 ENCOUNTER — Encounter: Payer: Self-pay | Admitting: Internal Medicine

## 2022-10-09 LAB — NMR, LIPOPROFILE
Cholesterol, Total: 240 mg/dL — ABNORMAL HIGH (ref 100–199)
HDL Particle Number: 36.6 umol/L
HDL-C: 55 mg/dL
LDL Particle Number: 1967 nmol/L — ABNORMAL HIGH
LDL Size: 20.4 nm — ABNORMAL LOW
LDL-C (NIH Calc): 160 mg/dL — ABNORMAL HIGH (ref 0–99)
LP-IR Score: 53 — ABNORMAL HIGH
Small LDL Particle Number: 1217 nmol/L — ABNORMAL HIGH
Triglycerides: 139 mg/dL (ref 0–149)

## 2022-10-15 ENCOUNTER — Other Ambulatory Visit (HOSPITAL_COMMUNITY): Payer: Self-pay

## 2022-10-15 ENCOUNTER — Encounter (HOSPITAL_BASED_OUTPATIENT_CLINIC_OR_DEPARTMENT_OTHER): Payer: Self-pay | Admitting: Internal Medicine

## 2022-10-15 ENCOUNTER — Telehealth: Payer: Self-pay

## 2022-10-15 ENCOUNTER — Ambulatory Visit (HOSPITAL_BASED_OUTPATIENT_CLINIC_OR_DEPARTMENT_OTHER): Payer: Managed Care, Other (non HMO) | Admitting: Internal Medicine

## 2022-10-15 VITALS — BP 166/85 | HR 54 | Ht 66.0 in | Wt 175.1 lb

## 2022-10-15 DIAGNOSIS — R931 Abnormal findings on diagnostic imaging of heart and coronary circulation: Secondary | ICD-10-CM

## 2022-10-15 DIAGNOSIS — Z8249 Family history of ischemic heart disease and other diseases of the circulatory system: Secondary | ICD-10-CM | POA: Diagnosis not present

## 2022-10-15 DIAGNOSIS — E7801 Familial hypercholesterolemia: Secondary | ICD-10-CM

## 2022-10-15 DIAGNOSIS — K760 Fatty (change of) liver, not elsewhere classified: Secondary | ICD-10-CM | POA: Diagnosis not present

## 2022-10-15 NOTE — Telephone Encounter (Signed)
-----   Message from Nurse Eileen Stanford E sent at 10/15/2022 11:33 AM EDT ----- Regarding: PA for PCSK9i Hey team   This patient needs a PA for Repatha 140mg /ml OR Praluent 150mg /ml -- depending on insurance preference.   Not a goal on max-tolerated statin, elevated coronary calcium score.   Thanks,

## 2022-10-15 NOTE — Patient Instructions (Signed)
Medication Instructions:  Dr. Rennis Golden recommends Repatha Sureclikc 140mg /mL OR Praluent 150mg /mL (PCSK9). This is an injectable cholesterol medication self-administered once every 14 days. This medication will likely need prior approval with your insurance company, which we will work on. If the medication is not approved initially, we may need to do an appeal with your insurance.   Administer medication in area of fatty tissue such as abdomen, outer thigh, back of upper arm - and rotate site with each injection Store medication in refrigerator until ready to administer - allow to sit at room temp for 30 mins - 1 hour prior to injection Dispose of medication in a SHARPS container - your pharmacy should be able to direct you on this and proper disposal   If you need a co-pay card for Repatha: Lawsponsor.fr If you need a co-pay card for Praluent: [email or web address]  Patient Assistance:    These foundations have funds at various times.   The [employer] Foundation: https://www.panfoundation.org/disease-funds/hypercholesterolemia/ -- can sign up for wait list  The [facility] offers assistance to help pay for medication copays.  They will cover copays for all cholesterol lowering meds, including statins, fibrates, omega-3 fish oils like Vascepa, ezetimibe, Repatha, Praluent, Nexletol, Nexlizet.  The cards are usually good for $2,500 or 12 months, whichever comes first. Our fax # is [phone] (you will need this to apply) Go to healthwellfoundation.org Click on "Apply Now" Answer questions as to whom is applying (patient or representative) Your disease fund will be "hypercholesterolemia - Medicare access" They will ask questions about finances and which medications you are taking for cholesterol When you submit, the approval is usually within minutes.  You will need to print the card information from the site You will need to show this information  to your pharmacy, they will bill your Medicare Part D plan first -then bill Health Well --for the copay.   You can also call them at [phone], although the hold times can be quite long.     *If you need a refill on your cardiac medications before your next appointment, please call your pharmacy*   Lab Work: FASTING lab work to check cholesterol in 3-4 months  If you have labs (blood work) drawn today and your tests are completely normal, you will receive your results only by: MyChart Message (if you have MyChart) OR A paper copy in the mail If you have any lab test that is abnormal or we need to change your treatment, we will call you to review the results.   Follow-Up: At [facility], you and your health needs are our priority.  As part of our continuing mission to provide you with exceptional heart care, we have created designated Provider Care Teams.  These Care Teams include your primary Cardiologist (physician) and Advanced Practice Providers (APPs -  Physician Assistants and Nurse Practitioners) who all work together to provide you with the care you need, when you need it.  We recommend signing up for the patient portal called "MyChart".  Sign up information is provided on this After Visit Summary.  MyChart is used to connect with patients for Virtual Visits (Telemedicine).  Patients are able to view lab/test results, encounter notes, upcoming appointments, etc.  Non-urgent messages can be sent to your provider as well.   To learn more about what you can do with MyChart, go to [email or web address].    Your next appointment:    3-4 months with Dr. Rennis Golden   Other Instructions You have been referred to  Annamarie Major NP -- GI

## 2022-10-15 NOTE — Progress Notes (Signed)
LIPID CLINIC CONSULT NOTE  Chief Complaint:  Follow-up dyslipidemia  Primary Care Physician: Shade Flood, MD  Primary Cardiologist:  None  HPI:  Brett Wright is a 42 y.o. male who is being seen today for the evaluation of dyslipidemia at the request of Shade Flood, MD. this is a pleasant 42 year old male kindly referred for evaluation management of dyslipidemia.  Brett Wright reports a longstanding history of high cholesterol and more recently he has been on therapy for this with 20 mg rosuvastatin daily.  He felt like he had a number of side effects with the medication including sexual side effects, myalgias and fatigue.  His cholesterol had actually gone up somewhat and it was recommended that he increase the dose of the medication however ultimately he stopped the medication and noted improvement in some of the side effects.  He then wished to be seen here for further evaluation as to what the possible causes are of his elevated cholesterol and to better understand his risk and what would be the best treatment options for him.  He tells me that there is heart disease in his family including his father who had single-vessel bypass in his mid 66s.  Recent lipid testing for him 6 months ago showed total cholesterol 261, triglycerides 87, HDL 67 and LDL 176.  His cholesterol 3 years ago showed total of 320 with LDL 233.  This is highly suggestive of a familial hyperlipidemia.  He reports his diet is fairly healthy with low in saturated fats and generally he does exercise and is pretty active.  10/15/2022  Brett Wright returns today for follow-up.  He was switched from rosuvastatin to atorvastatin due to side effects.  Initially had some hand cramping issues however that did not improve.  He then has stayed with the medication and has had a good response with his lipids.  LDL particle number is now 1967, down from 2852.  LDL is 160 (down from 262) and his small LDL particles remain at 1217.   He did undergo genetic testing which showed 4 variants of unknown significance.  1 was a variation in the LPA gene, however his serum LP(a) level was negative.  He also had an APO E variation (E3/E4) which I think is driving his very high cholesterol and cardiovascular risk.  We also discussed the potential increased risk of Alzheimer's type dementia.  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  He already has some known nonalcoholic fatty liver disease on his CT scan.  Although his cholesterol has improved significantly, he remains well above target is for optimal benefit and would likely need additional therapy.  PMHx:  Past Medical History:  Diagnosis Date   Anxiety    no current issue -- many years ago   GERD (gastroesophageal reflux disease)    History of chicken pox     Past Surgical History:  Procedure Laterality Date   CLOSED REDUCTION RADIAL HEAD / NECK FRACTURE     rod, screw, pins, plates   OPEN REDUCTION INTERNAL FIXATION (ORIF) TIBIA/FIBULA FRACTURE  2008   rod & screws; [facility]   TONSILLECTOMY     WISDOM TOOTH EXTRACTION      FAMHx:  Family History  Problem Relation Age of Onset   Hypertension Father        Lving   Anxiety disorder  Father        stress   Heart disease Father    Thyroid disease Mother        Living   Heart attack Maternal Grandmother        in 43s   Thyroid disease Maternal Grandmother    Lung cancer Paternal Uncle        SMOKER   Heart disease Other        Paternal Side   Healthy Son        x2   Diabetes Neg Hx    Stroke Neg Hx     SOCHx:   reports that he quit smoking about 23 years ago. His smoking use included cigarettes. He has never used smokeless tobacco. He reports current alcohol use. He reports that he does not use drugs.  ALLERGIES:  Allergies  Allergen Reactions   Pollen Extract      Allergic rhinoconjunctivitis   Crestor [Rosuvastatin]     Myalgias, Fatigue   Penicillins      ? Reaction as child    ROS: Pertinent items noted in HPI and remainder of comprehensive ROS otherwise negative.  HOME MEDS: Current Outpatient Medications on File Prior to Visit  Medication Sig Dispense Refill   atorvastatin (LIPITOR) 40 MG tablet Take 1 tablet (40 mg total) by mouth at bedtime. 90 tablet 3   Coenzyme Q10 (CO Q-10 PO) Take 1 capsule by mouth daily.     Multiple Vitamin (MULTIVITAMIN) capsule Take 1 tablet by mouth daily.     sertraline (ZOLOFT) 25 MG tablet TAKE 1 TABLET(25 MG) BY MOUTH DAILY 90 tablet 3   No current facility-administered medications on file prior to visit.    LABS/IMAGING: No results found for this or any previous visit (from the past 48 hour(s)). No results found.  LIPID PANEL:    Component Value Date/Time   CHOL 261 (H) 12/03/2021 1234   TRIG 87.0 12/03/2021 1234   HDL 67.50 12/03/2021 1234   CHOLHDL 4 12/03/2021 1234   VLDL 17.4 12/03/2021 1234   LDLCALC 176 (H) 12/03/2021 1234   LDLDIRECT 176.0 06/29/2017 0732    WEIGHTS: Wt Readings from Last 3 Encounters:  10/15/22 175 lb 1.6 oz (79.4 kg)  06/03/22 169 lb 8 oz (76.9 kg)  12/03/21 177 lb 3.2 oz (80.4 kg)    VITALS: BP (!) 166/85 (BP Location: Left Arm, Patient Position: Sitting, Cuff Size: Large)   Pulse (!) 54   Ht 5\' 6"  (1.676 m)   Wt 175 lb 1.6 oz (79.4 kg)   SpO2 99%   BMI 28.26 kg/m   EXAM: Deferred  EKG: Deferred  ASSESSMENT: Probable familial hyperlipidemia, LDL greater than 190, by Tedra Coupe criteria 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 Father with early onset coronary artery disease, status post CABG x 1 Intolerance to rosuvastatin Hepatic steatosis  PLAN: 1.   Brett Wright has had a positive response to atorvastatin but still has an elevated cholesterol.  He needs  an additional 50+ percent reduction in cholesterol and I would recommend adding a PCSK9 inhibitor.  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  Based on these findings, I think it would be important for him to establish with a hepatic specialist early to determine whether or  not he might be a candidate for any of the novel therapies that have recently been approved for this disorder.  He is agreeable to the referral.  Plan follow-up with me in about 3 months with repeat lipid NMR.  Chrystie Nose, MD, [facility], FACP  [place]  [facility]  Medical Director of the Advanced Lipid Disorders &  Cardiovascular Risk Reduction Clinic Diplomate of the American Board of Clinical Lipidology Attending Cardiologist  Direct Dial: [phone]  Fax: [phone]  Website:  [email or web address] Posey [email or web address] 10/15/2022, 11:04 AM

## 2022-10-15 NOTE — Telephone Encounter (Signed)
Update sent to patient via MyChart 

## 2022-10-15 NOTE — Telephone Encounter (Signed)
Pharmacy Patient Advocate Encounter   Received notification from Physician's Office that prior authorization for REPATHA is required/requested.   Insurance verification completed.   The patient is insured through [employer] .   Per test claim: The current 84 day co-pay is, $0.  No PA needed at this time. This test claim was processed through [facility]- copay amounts may vary at other pharmacies due to pharmacy/plan contracts, or as the patient moves through the different stages of their insurance plan.

## 2022-10-16 MED ORDER — REPATHA SURECLICK 140 MG/ML ~~LOC~~ SOAJ: 140.0000 mg | SUBCUTANEOUS | 3 refills | Status: DC

## 2022-10-16 NOTE — Addendum Note (Signed)
Addended by: Lindell Spar on: 10/16/2022 07:47 AM   Modules accepted: Orders

## 2022-10-16 NOTE — Telephone Encounter (Signed)
Rx(s) sent to pharmacy electronically.  

## 2023-01-22 DIAGNOSIS — Z1589 Genetic susceptibility to other disease: Secondary | ICD-10-CM | POA: Insufficient documentation

## 2023-02-04 ENCOUNTER — Encounter: Payer: Self-pay | Admitting: Family Medicine

## 2023-02-10 ENCOUNTER — Other Ambulatory Visit: Payer: Self-pay | Admitting: Internal Medicine

## 2023-02-11 MED ORDER — ATORVASTATIN CALCIUM 40 MG PO TABS: 40.0000 mg | ORAL_TABLET | Freq: Every day | ORAL | 2 refills | Status: AC

## 2023-02-17 ENCOUNTER — Ambulatory Visit (HOSPITAL_BASED_OUTPATIENT_CLINIC_OR_DEPARTMENT_OTHER): Payer: Managed Care, Other (non HMO) | Admitting: Internal Medicine

## 2023-05-14 ENCOUNTER — Other Ambulatory Visit: Payer: Self-pay | Admitting: Family Medicine

## 2023-05-14 DIAGNOSIS — F32A Depression, unspecified: Secondary | ICD-10-CM

## 2023-06-11 ENCOUNTER — Encounter (HOSPITAL_BASED_OUTPATIENT_CLINIC_OR_DEPARTMENT_OTHER): Payer: Self-pay | Admitting: Internal Medicine

## 2023-06-11 ENCOUNTER — Ambulatory Visit (HOSPITAL_BASED_OUTPATIENT_CLINIC_OR_DEPARTMENT_OTHER): Payer: Managed Care, Other (non HMO) | Admitting: Internal Medicine

## 2023-06-11 VITALS — BP 154/70 | HR 66 | Ht 66.5 in | Wt 170.0 lb

## 2023-06-11 DIAGNOSIS — E7801 Familial hypercholesterolemia: Secondary | ICD-10-CM

## 2023-06-11 DIAGNOSIS — E785 Hyperlipidemia, unspecified: Secondary | ICD-10-CM | POA: Diagnosis not present

## 2023-06-11 LAB — NMR, LIPOPROFILE
Cholesterol, Total: 169 mg/dL (ref 100–199)
HDL Particle Number: 44.9 umol/L (ref >=30.5)
HDL-C: 70 mg/dL (ref >39)
LDL Particle Number: 902 nmol/L (ref <1000)
LDL Size: 20.1 nm — ABNORMAL LOW (ref >20.5)
LDL-C (NIH Calc): 71 mg/dL (ref 0–99)
LP-IR Score: 57 — ABNORMAL HIGH (ref <=45)
Small LDL Particle Number: 487 nmol/L (ref <=527)
Triglycerides: 170 mg/dL — ABNORMAL HIGH (ref 0–149)

## 2023-06-11 NOTE — Patient Instructions (Signed)
 Medication Instructions:  Your physician recommends that you continue on your current medications as directed. Please refer to the Current Medication list given to you today.  *If you need a refill on your cardiac medications before your next appointment, please call your pharmacy*  Lab Work: NMR in 1 year (prior to follow up appt) If you have labs (blood work) drawn today and your tests are completely normal, you will receive your results only by: MyChart Message (if you have MyChart) OR A paper copy in the mail If you have any lab test that is abnormal or we need to change your treatment, we will call you to review the results.   Follow-Up: At [facility], you and your health needs are our priority.  As part of our continuing mission to provide you with exceptional heart care, our providers are all part of one team.  This team includes your primary Cardiologist (physician) and Advanced Practice Providers or APPs (Physician Assistants and Nurse Practitioners) who all work together to provide you with the care you need, when you need it.  Your next appointment:   1 year(s)  Provider:   Slater Duncan, NP

## 2023-06-11 NOTE — Progress Notes (Signed)
 LIPID CLINIC CONSULT NOTE  Chief Complaint:  Follow-up dyslipidemia  Primary Care Physician: Benjiman Bras, MD  Primary Cardiologist:  None  HPI:  Brett Wright is a 43 y.o. male who is being seen today for the evaluation of dyslipidemia at the request of Benjiman Bras, MD. this is a pleasant 43 year old male kindly referred for evaluation management of dyslipidemia.  Brett Wright reports a longstanding history of high cholesterol and more recently he has been on therapy for this with 20 mg rosuvastatin  daily.  He felt like he had a number of side effects with the medication including sexual side effects, myalgias and fatigue.  His cholesterol had actually gone up somewhat and it was recommended that he increase the dose of the medication however ultimately he stopped the medication and noted improvement in some of the side effects.  He then wished to be seen here for further evaluation as to what the possible causes are of his elevated cholesterol and to better understand his risk and what would be the best treatment options for him.  He tells me that there is heart disease in his family including his father who had single-vessel bypass in his mid 37s.  Recent lipid testing for him 6 months ago showed total cholesterol 261, triglycerides 87, HDL 67 and LDL 176.  His cholesterol 3 years ago showed total of 320 with LDL 233.  This is highly suggestive of a familial hyperlipidemia.  He reports his diet is fairly healthy with low in saturated fats and generally he does exercise and is pretty active.  10/15/2022  Brett Wright returns today for follow-up.  He was switched from rosuvastatin  to atorvastatin  due to side effects.  Initially had some hand cramping issues however that did not improve.  He then has stayed with the medication and has had a good response with his lipids.  LDL particle number is now 1967, down from 2852.  LDL is 160 (down from 262) and his small LDL particles remain at 1217.   He did undergo genetic testing which showed 4 variants of unknown significance.  1 was a variation in the LPA gene, however his serum LP(a) level was negative.  He also had an APO E variation (E3/E4) which I think is driving his very high cholesterol and cardiovascular risk.  We also discussed the potential increased risk of Alzheimer's type dementia.  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  He already has some known nonalcoholic fatty liver disease on his CT scan.  Although his cholesterol has improved significantly, he remains well above target is for optimal benefit and would likely need additional therapy.  06/11/2023  Brett Wright is seen today for follow-up.  He seems to be doing well.  He is tolerating Repatha  in addition to his statin.  He had lab work yesterday however it has not yet available for me to review.  He says he is having no issues with the medication.  He did see Duey Ghent, NP in hepatology for evaluation of his genetic variant which is described above.  A FibroScan was negative and the plan I think is follow-up in a year and ongoing for surveillance due to his potentially higher genetic risk of developing liver disease.  PMHx:  Past Medical History:  Diagnosis Date   Anxiety    no current issue --  many years ago   GERD (gastroesophageal reflux disease)    History of chicken pox     Past Surgical History:  Procedure Laterality Date   CLOSED REDUCTION RADIAL HEAD / NECK FRACTURE     rod, screw, pins, plates   OPEN REDUCTION INTERNAL FIXATION (ORIF) TIBIA/FIBULA FRACTURE  2008   rod & screws; [facility]   TONSILLECTOMY     WISDOM TOOTH EXTRACTION      FAMHx:  Family History  Problem Relation Age of Onset   Hypertension Father        Lving   Anxiety disorder Father        stress   Heart disease Father    Thyroid  disease Mother         Living   Heart attack Maternal Grandmother        in 46s   Thyroid  disease Maternal Grandmother    Lung cancer Paternal Uncle        SMOKER   Heart disease Other        Paternal Side   Healthy Son        x2   Diabetes Neg Hx    Stroke Neg Hx     SOCHx:   reports that he quit smoking about 24 years ago. His smoking use included cigarettes. He has never used smokeless tobacco. He reports current alcohol use. He reports that he does not use drugs.  ALLERGIES:  Allergies  Allergen Reactions   Pollen Extract     Allergic rhinoconjunctivitis   Crestor  [Rosuvastatin ]     Myalgias, Fatigue   Penicillins      ? Reaction as child    ROS: Pertinent items noted in HPI and remainder of comprehensive ROS otherwise negative.  HOME MEDS: Current Outpatient Medications on File Prior to Visit  Medication Sig Dispense Refill   Coenzyme Q10 (CO Q-10 PO) Take 1 capsule by mouth daily.     Evolocumab  (REPATHA  SURECLICK) 140 MG/ML SOAJ Inject 140 mg into the skin every 14 (fourteen) days. 6 mL 3   Multiple Vitamin (MULTIVITAMIN) capsule Take 1 tablet by mouth daily.     sertraline  (ZOLOFT ) 25 MG tablet TAKE 1 TABLET(25 MG) BY MOUTH DAILY 90 tablet 3   atorvastatin  (LIPITOR) 40 MG tablet Take 1 tablet (40 mg total) by mouth at bedtime. 90 tablet 2   No current facility-administered medications on file prior to visit.    LABS/IMAGING: No results found for this or any previous visit (from the past 48 hours). No results found.  LIPID PANEL:    Component Value Date/Time   CHOL 261 (H) 12/03/2021 1234   TRIG 87.0 12/03/2021 1234   HDL 67.50 12/03/2021 1234   CHOLHDL 4 12/03/2021 1234   VLDL 17.4 12/03/2021 1234   LDLCALC 176 (H) 12/03/2021 1234   LDLDIRECT 176.0 06/29/2017 0732    WEIGHTS: Wt Readings from Last 3 Encounters:  06/11/23 170 lb (77.1 kg)  10/15/22 175 lb 1.6 oz (79.4 kg)  06/03/22 169 lb 8 oz (76.9 kg)    VITALS: BP (!) 154/70   Pulse 66   Ht 5' 6.5"  (1.689 m)   Wt 170 lb (77.1 kg)   SpO2 98%   BMI 27.03 kg/m   EXAM: Deferred  EKG: Deferred  ASSESSMENT: Probable familial hyperlipidemia, LDL greater than 190, by Molly Angers criteria 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 Father with early onset  coronary artery disease, status post CABG x 1 Intolerance to rosuvastatin  Hepatic steatosis  PLAN: 1.   Brett Wright seems to be doing well on combination therapy with atorvastatin  and Repatha .  He had labs yesterday.  Those are still pending.  I will plan to follow-up with him regarding those results but likely will continue with this therapy.  He is now plugged into the hepatology clinic with [facility].  They will continue to follow him due to his genetic disorder.  Otherwise he can follow-up with us , likely our lipid APP in 1 year or sooner as necessary.  Hazle Lites, MD, [facility], FNLA, FACP  [place]  [facility]  Medical Director of the Advanced Lipid Disorders &  Cardiovascular Risk Reduction Clinic Diplomate of the American Board of Clinical Lipidology Attending Cardiologist  Direct Dial: [phone]  Fax: [phone]  Website:  [email or web address] [email or web address] Otto Caraway 06/11/2023, 1:36 PM

## 2023-06-14 ENCOUNTER — Other Ambulatory Visit: Payer: Self-pay | Admitting: Family Medicine

## 2023-06-14 DIAGNOSIS — E785 Hyperlipidemia, unspecified: Secondary | ICD-10-CM

## 2023-10-04 ENCOUNTER — Other Ambulatory Visit: Payer: Self-pay

## 2023-10-04 ENCOUNTER — Telehealth: Payer: Self-pay

## 2023-10-04 DIAGNOSIS — F32A Depression, unspecified: Secondary | ICD-10-CM

## 2023-10-04 MED ORDER — SERTRALINE HCL 25 MG PO TABS: ORAL_TABLET | ORAL | 0 refills | Status: DC

## 2023-10-04 MED ORDER — SERTRALINE HCL 25 MG PO TABS: ORAL_TABLET | ORAL | 3 refills | Status: DC

## 2023-10-04 NOTE — Telephone Encounter (Signed)
 Made patient an appointment for 10/18/2023 patient was made aware cannot continue refills without this visit

## 2023-10-04 NOTE — Addendum Note (Signed)
 Addended by: Siddiq Kaluzny A on: 10/04/2023 02:11 PM   Modules accepted: Orders

## 2023-10-04 NOTE — Telephone Encounter (Signed)
30 day supply sent to pharmacy.  

## 2023-10-04 NOTE — Progress Notes (Signed)
 Called [facility] to let them know not to fill the 90D supply as that was an error. Okay to fill 30D supply.

## 2023-10-04 NOTE — Telephone Encounter (Signed)
 Last OV 12/03/2021 please advise, would you like to see patient before sending more Rx or willing to send while waiting on appt date.

## 2023-10-04 NOTE — Telephone Encounter (Signed)
 I am ok with a 30 day supply IF he schedules an appointment and will need to keep that appointment for any further refills.

## 2023-10-04 NOTE — Telephone Encounter (Signed)
 Copied from CRM #[id number]. Topic: Clinical - Medication Question >> Oct 04, 2023  9:56 AM [place] A wrote: Reason for CRM: Patient would like to know if he can have the sertraline  (ZOLOFT ) 25 MG tablet refilled even though there are no refills left or could he have some pills until his next appt. Next appt was not scheduled yet due to patient wanting to speak with a nurse.

## 2023-10-18 ENCOUNTER — Ambulatory Visit: Admitting: Family Medicine

## 2023-10-18 ENCOUNTER — Encounter: Payer: Self-pay | Admitting: Family Medicine

## 2023-10-18 VITALS — BP 118/78 | HR 74 | Temp 98.6°F | Resp 16 | Ht 66.5 in | Wt 171.8 lb

## 2023-10-18 DIAGNOSIS — Z1211 Encounter for screening for malignant neoplasm of colon: Secondary | ICD-10-CM | POA: Diagnosis not present

## 2023-10-18 DIAGNOSIS — E785 Hyperlipidemia, unspecified: Secondary | ICD-10-CM

## 2023-10-18 DIAGNOSIS — G4700 Insomnia, unspecified: Secondary | ICD-10-CM | POA: Diagnosis not present

## 2023-10-18 DIAGNOSIS — F419 Anxiety disorder, unspecified: Secondary | ICD-10-CM

## 2023-10-18 DIAGNOSIS — K760 Fatty (change of) liver, not elsewhere classified: Secondary | ICD-10-CM

## 2023-10-18 DIAGNOSIS — F32A Depression, unspecified: Secondary | ICD-10-CM

## 2023-10-18 MED ORDER — SERTRALINE HCL 25 MG PO TABS: ORAL_TABLET | ORAL | 3 refills | Status: AC

## 2023-10-18 NOTE — Progress Notes (Unsigned)
 Subjective:  Patient ID: Brett Wright, male    DOB: July 20, 1980  Age: 43 y.o. MRN: [id number]  CC:  Chief Complaint  Patient presents with   Follow-up    Medication check and labs. No questions or concerns. Wants to talk about ADHD    HPI Brett Wright presents for  Medication follow-up.  Last visit in October 2023.  Had not been seen since October 2022 at that time when he established care. No barriers to care.   Hyperlipidemia: Family history of coronary disease with father having CABG in his mid 12s.  Goal LDL ideally under 70.  Under care of Dr. Mona - on repatha  injection every 2 weeks, lipitor 40mg  every day.  OV with Dr. Mona on 06/11/23.  NMR lipoprofile  on 06/10/23 - LDL 71, HDL 70, trig 170 Lab Results  Component Value Date   CHOL 261 (H) 12/03/2021   HDL 67.50 12/03/2021   LDLCALC 176 (H) 12/03/2021   LDLDIRECT 176.0 06/29/2017   TRIG 87.0 12/03/2021   CHOLHDL 4 12/03/2021   Lab Results  Component Value Date   ALT 49 12/03/2021   AST 31 12/03/2021   ALKPHOS 43 12/03/2021   BILITOT 0.6 12/03/2021   Depression: With anxiety, last discussed in October 2023.  Difficulty with sleep previously.  Tried melatonin, magnesium with decreased effectiveness.  He was continued on sertraline  25 mg daily at his last visit, sleep hygiene discussed, option of temporary Tylenol PM and plan to follow-up within a few weeks.  Option of higher dose sertraline  if needed.  Feels like current dose of sertraline  takes edge off, able work through stressors, but not feeling side effects like had with higher dose in past. Managing stressors ok at this time. Melatonin at times for sleep at times, 5mg . Magnesium at times.   He does have question of whether he may have ADHD.  Oldest son with ADD. Father diagnosed with adult onset ADD.  Multitasking, multiple projects open at a time. Not interested in meds or formal eval at this time, but will let me know.      10/18/2023    3:30 PM 12/03/2021    11:46 AM 11/22/2020    9:31 AM 05/25/2019    8:48 AM 05/14/2017    9:03 AM  Depression screen PHQ 2/9  Decreased Interest 1 2 1  0 0  Down, Depressed, Hopeless 0 1 0 0 0  PHQ - 2 Score 1 3 1  0 0  Altered sleeping 2 3 1  0 0  Tired, decreased energy 1 3 1  0 0  Change in appetite 1 2 1  0 0  Feeling bad or failure about yourself  0 0 0 0 0  Trouble concentrating 3 1 0 0 0  Moving slowly or fidgety/restless 0 0 0 0 0  Suicidal thoughts 0 0 0 0 0  PHQ-9 Score 8 12 4  0 0  Difficult doing work/chores Somewhat difficult   Not difficult at all Not difficult at all   Hepatic steatosis: Followed by [facility], liver care and transplant. Dawn Drazek. 01/2023 eval - 1 year follow up.   HM: No FH of colon CA. No personal hx of polyps. Screening options with colonoscopy versus Cologuard discussed. Discussed timing of repeat testing intervals if normal, as well as potential need for diagnostic Colonoscopy if positive Cologuard. Understanding expressed, and chose Cologuard.  Declined flu vaccine.     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   Anxiety and depression 09/03/2015   Hyperlipidemia 05/17/2015   Visit for preventive health examination 02/15/2015   Past Medical History:  Diagnosis Date   Anxiety    no current issue -- many years ago   GERD (gastroesophageal reflux disease)    History of chicken pox    Past Surgical History:  Procedure Laterality Date   CLOSED REDUCTION RADIAL HEAD / NECK FRACTURE     rod, screw, pins, plates   OPEN REDUCTION INTERNAL FIXATION (ORIF) TIBIA/FIBULA FRACTURE  2008   rod & screws; [facility]   TONSILLECTOMY     WISDOM TOOTH EXTRACTION     Allergies  Allergen Reactions   Bee Pollen     Allergic rhinoconjunctivitis   Pollen Extract     Allergic rhinoconjunctivitis   Crestor  [Rosuvastatin ]     Myalgias, Fatigue   Penicillins      ? Reaction as child   Prior to Admission medications   Medication  Sig Start Date End Date Taking? Authorizing Provider  atorvastatin  (LIPITOR) 40 MG tablet Take 1 tablet (40 mg total) by mouth at bedtime. 02/11/23 10/18/23 Yes Hilty, Vinie BROCKS, MD  Coenzyme Q10 (CO Q-10 PO) Take 1 capsule by mouth daily.   Yes [provider]  Evolocumab  (REPATHA  SURECLICK) 140 MG/ML SOAJ Inject 140 mg into the skin every 14 (fourteen) days. 10/16/22  Yes Hilty, Vinie BROCKS, MD  Multiple Vitamin (MULTIVITAMIN) capsule Take 1 tablet by mouth daily.   Yes [provider]  sertraline  (ZOLOFT ) 25 MG tablet TAKE 1 TABLET(25 MG) BY MOUTH DAILY 10/04/23  Yes Levora Reyes SAUNDERS, MD  rosuvastatin  (CRESTOR ) 20 MG tablet TAKE 1 TABLET(20 MG) BY MOUTH DAILY Patient not taking: Reported on 10/18/2023 06/14/23   Levora Reyes SAUNDERS, MD   Social History   Socioeconomic History   Marital status: Single    Spouse name: Not on file   Number of children: Not on file   Years of education: Not on file   Highest education level: Not on file  Occupational History   Occupation: financial advisor  Tobacco Use   Smoking status: Former    Current packs/day: 0.00    Types: Cigarettes    Quit date: 02/10/1999    Years since quitting: 24.7   Smokeless tobacco: Never   Tobacco comments:    smoked 1996-2001 , 1 pp MONTH  Vaping Use   Vaping status: Never Used  Substance and Sexual Activity   Alcohol use: Yes    Alcohol/week: 0.0 standard drinks of alcohol    Comment: SOCIALLY   Drug use: No   Sexual activity: Yes    Partners: Female    Comment: wife  Other Topics Concern   Not on file  Social History Narrative   Regular Exercise-3x wkly      Social Drivers of Health   Financial Resource Strain: Not on file  Food Insecurity: Low Risk  (01/22/2023)   Received from [facility]   Hunger Vital Sign    Within the past 12 months, you worried that your food would run out before you got money to buy more: Never true    Within the past 12 months, the food you bought just didn't last and  you didn't have money to get more. : Never true  Transportation Needs: No Transportation Needs (01/22/2023)   Received from [facility]    In the past 12 months, has lack of reliable transportation kept you from medical appointments, meetings,  work or from getting things needed for daily living? : No  Physical Activity: Not on file  Stress: Not on file  Social Connections: Not on file  Intimate Partner Violence: Not on file    Review of Systems Per H PI  Objective:   Vitals:   10/18/23 1523  BP: 118/78  Pulse: 74  Resp: 16  Temp: 98.6 F (37 C)  TempSrc: Temporal  SpO2: 96%  Weight: 171 lb 12.8 oz (77.9 kg)  Height: 5' 6.5 (1.689 m)     Physical Exam Vitals reviewed.  Constitutional:      Appearance: He is well-developed.  HENT:     Head: Normocephalic and atraumatic.  Neck:     Vascular: No carotid bruit or JVD.  Cardiovascular:     Rate and Rhythm: Normal rate and regular rhythm.     Heart sounds: Normal heart sounds. No murmur heard. Pulmonary:     Effort: Pulmonary effort is normal.     Breath sounds: Normal breath sounds. No rales.  Musculoskeletal:     Right lower leg: No edema.     Left lower leg: No edema.  Skin:    General: Skin is warm and dry.  Neurological:     Mental Status: He is alert and oriented to person, place, and time.  Psychiatric:        Mood and Affect: Mood normal.        Assessment & Plan:  Brett Wright is a 43 y.o. male . Anxiety Anxiety and depression - Plan: sertraline  (ZOLOFT ) 25 MG tablet Insomnia, unspecified type  - In spite of stressors above reports sufficient control with current med regimen.  Side effects on higher dosing.  We did discuss possibility of attention deficit disorder and testing to evaluate that further but minimal interference with daily functioning with current symptoms  -defers testing and happy to place a referral for neuropsych testing, ADD testing locally if needed or [place]  attention specialist is another option.  He will let me know.  Hyperlipidemia, unspecified hyperlipidemia type  - Significant improvement with meds, treatment as above by lipid specialist.  Appreciate their assistance.  Continue routine follow-up.  Screen for colon cancer - Plan: Cologuard  Hepatic steatosis  - Seen previously by liver specialist, continue routine follow-up.  Meds ordered this encounter  Medications   sertraline  (ZOLOFT ) 25 MG tablet    Sig: TAKE 1 TABLET(25 MG) BY MOUTH DAILY    Dispense:  90 tablet    Refill:  3   Patient Instructions  Thanks for coming in today.  Continue sertraline  same dose, I will send in a refill.  Let me know if you need the phone number for further evaluation for possible ADD symptoms, or I can refer you for neuropsych testing.  Cologuard was ordered, should be shipped to your house in the next week or 2.  Keep follow-up with specialist as planned including for ongoing lab monitoring.  Follow-up with me in 1 year for physical unless any new symptoms.  Happy to see you sooner.  Take care!      Signed,   Reyes Pines, MD [facility], [facility] 10/18/23 3:51 PM

## 2023-10-18 NOTE — Patient Instructions (Signed)
 Thanks for coming in today.  Continue sertraline  same dose, I will send in a refill.  Let me know if you need the phone number for further evaluation for possible ADD symptoms, or I can refer you for neuropsych testing.  Cologuard was ordered, should be shipped to your house in the next week or 2.  Keep follow-up with specialist as planned including for ongoing lab monitoring.  Follow-up with me in 1 year for physical unless any new symptoms.  Happy to see you sooner.  Take care!

## 2023-11-05 ENCOUNTER — Ambulatory Visit: Payer: Self-pay | Admitting: Family Medicine

## 2023-11-05 LAB — COLOGUARD: COLOGUARD: NEGATIVE

## 2023-11-09 ENCOUNTER — Other Ambulatory Visit: Payer: Self-pay | Admitting: Internal Medicine

## 2023-11-09 DIAGNOSIS — E785 Hyperlipidemia, unspecified: Secondary | ICD-10-CM

## 2023-11-09 DIAGNOSIS — E78019 Familial hypercholesterolemia, unspecified: Secondary | ICD-10-CM

## 2023-11-09 DIAGNOSIS — R931 Abnormal findings on diagnostic imaging of heart and coronary circulation: Secondary | ICD-10-CM

## 2024-02-09 ENCOUNTER — Encounter: Payer: Self-pay | Admitting: Family Medicine

## 2024-02-11 NOTE — Telephone Encounter (Signed)
 Okay with sending referral? I can call patient to come in if needed. Patient was last seen in september 2025.

## 2024-02-14 NOTE — Telephone Encounter (Signed)
 Called patient and scheduled acute visit this Thursday to discuss tennis elbow

## 2024-02-17 ENCOUNTER — Ambulatory Visit: Admitting: Family Medicine

## 2024-03-13 ENCOUNTER — Ambulatory Visit: Admitting: Family Medicine

## 2024-03-15 ENCOUNTER — Encounter: Payer: Self-pay | Admitting: Family Medicine

## 2024-03-15 ENCOUNTER — Ambulatory Visit: Admitting: Family Medicine

## 2024-03-15 ENCOUNTER — Encounter (HOSPITAL_BASED_OUTPATIENT_CLINIC_OR_DEPARTMENT_OTHER): Payer: Self-pay | Admitting: Internal Medicine

## 2024-03-15 VITALS — BP 120/78 | HR 66 | Temp 98.0°F | Resp 16 | Ht 66.5 in | Wt 175.6 lb

## 2024-03-15 DIAGNOSIS — M7711 Lateral epicondylitis, right elbow: Secondary | ICD-10-CM

## 2024-03-15 NOTE — Progress Notes (Signed)
 "  Subjective:  Patient ID: Brett Wright, male    DOB: 08-May-1980  Age: 44 y.o. MRN: [id number]  CC:  Chief Complaint  Patient presents with   Acute Visit    Right elbow injury. Unknown how he injured elbow. 2 weeks before thanksgiving. Not getting better. Has done PT a couple times. Ibuprofen a couple times.     HPI Brett Wright presents for   Right elbow pain: See MyChart message December 31.  Symptoms have been present for 45 days at that time.  Various treatments by therapists including scraping, taping of forearm.  Requested referral at that time but recommended in person evaluation initially to verify diagnosis and treatment options.  Started 2 weeks prior to Thanksgiving. He is right arm dominant.  3 hours of leaves using blower, then 45 bales of pine needles, then throwing baseball.  No trauma. No initial pain, but noticed few days later, trouble gripping, lighting.  No neck pain.  No swelling.  Sore on outside of elbow.  Cupping and scraping as above by PT - no sustained relief. Ok with daily functions, pain to shake hand, certain activities with finger strength.  Tx: advil 600mg , tylenol - helped pain, but no change afterwards.  Has counterforce brace - would use with golf or activities, but uncomfortable to wear.   Now starting to hurt on inside of elbow past few weeks - may be modifying activity. No golf/tennis.     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   Anxiety and depression 09/03/2015   Hyperlipidemia 05/17/2015   Visit for preventive health examination 02/15/2015   Past Medical History:  Diagnosis Date   Anxiety    no current issue -- many years ago   GERD (gastroesophageal reflux disease)    History of chicken pox    Past Surgical History:  Procedure Laterality Date   CLOSED REDUCTION RADIAL HEAD / NECK FRACTURE     rod, screw, pins, plates   OPEN REDUCTION INTERNAL FIXATION (ORIF) TIBIA/FIBULA FRACTURE   2008   rod & screws; [facility]   TONSILLECTOMY     WISDOM TOOTH EXTRACTION     Allergies[1] Prior to Admission medications  Medication Sig Start Date End Date Taking? Authorizing Provider  atorvastatin  (LIPITOR) 40 MG tablet Take 1 tablet (40 mg total) by mouth at bedtime. 02/11/23 03/15/24 Yes Hilty, Vinie BROCKS, MD  Evolocumab  (REPATHA  SURECLICK) 140 MG/ML SOAJ ADMINISTER 1 ML UNDER THE SKIN EVERY 14 DAYS 11/09/23  Yes Hilty, Vinie BROCKS, MD  sertraline  (ZOLOFT ) 25 MG tablet TAKE 1 TABLET(25 MG) BY MOUTH DAILY 10/18/23  Yes Levora Reyes SAUNDERS, MD  Coenzyme Q10 (CO Q-10 PO) Take 1 capsule by mouth daily.    [provider]  Multiple Vitamin (MULTIVITAMIN) capsule Take 1 tablet by mouth daily.    [provider]   Social History   Socioeconomic History   Marital status: Single    Spouse name: Not on file   Number of children: Not on file   Years of education: Not on file   Highest education level: Not on file  Occupational History   Occupation: financial advisor  Tobacco Use   Smoking status: Former    Current packs/day: 0.00    Types: Cigarettes    Quit date: 02/10/1999    Years since quitting: 25.1   Smokeless tobacco: Never   Tobacco comments:    smoked 1996-2001 , 1 pp MONTH  Vaping Use  Vaping status: Never Used  Substance and Sexual Activity   Alcohol use: Yes    Alcohol/week: 0.0 standard drinks of alcohol    Comment: SOCIALLY   Drug use: No   Sexual activity: Yes    Partners: Female    Comment: wife  Other Topics Concern   Not on file  Social History Narrative   Regular Exercise-3x wkly      Social Drivers of Health   Tobacco Use: Medium Risk (03/15/2024)   Patient History    Smoking Tobacco Use: Former    Smokeless Tobacco Use: Never    Passive Exposure: Not on Actuary Strain: Not on file  Food Insecurity: Low Risk (01/12/2024)   Received from [facility]   Epic    Within the past 12 months, you worried that your food would run  out before you got money to buy more: Never true    Within the past 12 months, the food you bought just didn't last and you didn't have money to get more. : Never true  Transportation Needs: No Transportation Needs (01/12/2024)   Received from [facility]    In the past 12 months, has lack of reliable transportation kept you from medical appointments, meetings, work or from getting things needed for daily living? : No  Physical Activity: Not on file  Stress: Not on file  Social Connections: Not on file  Intimate Partner Violence: Not on file  Depression (PHQ2-9): Medium Risk (10/18/2023)   Depression (PHQ2-9)    PHQ-2 Score: 8  Alcohol Screen: Not on file  Housing: Low Risk (01/12/2024)   Received from [facility]   Epic    What is your living situation today?: I have a steady place to live    Think about the place you live. Do you have problems with any of the following? Choose all that apply:: None/None on this list  Utilities: Low Risk (01/12/2024)   Received from [facility]   Utilities    In the past 12 months has the electric, gas, oil, or water company threatened to shut off services in your home? : No  Health Literacy: Not on file    Review of Systems Per HPI  Objective:   Vitals:   03/15/24 0945  BP: 120/78  Pulse: 66  Resp: 16  Temp: 98 F (36.7 C)  TempSrc: Temporal  SpO2: 98%  Weight: 175 lb 9.6 oz (79.7 kg)  Height: 5' 6.5 (1.689 m)     Physical Exam Constitutional:      General: He is not in acute distress.    Appearance: Normal appearance. He is well-developed.  HENT:     Head: Normocephalic and atraumatic.  Cardiovascular:     Rate and Rhythm: Normal rate.  Pulmonary:     Effort: Pulmonary effort is normal.  Musculoskeletal:     Comments: Right hand, pain-free exam, intact strength including with finger opposition, okay sign, negative Tinel, Phalen at wrist, pain-free range of motion.  Full strength. Forearm nontender,  including over radial tunnel.  Reproducible discomfort over the lateral epicondyle only on the right.  Olecranon and medial epicondyle nontender.  Full range of motion of elbow.  Skin intact, slight discoloration of prior area of cupping, lateral forearm, otherwise no rash or discoloration/ecchymosis.  Slight discomfort with resisted wrist extension, middle finger extension. Neurovascular intact distally.  Neurological:     Mental Status: He is alert and oriented to person, place, and time.  Psychiatric:  Mood and Affect: Mood normal.        Assessment & Plan:  Brett Wright is a 44 y.o. male . Right lateral epicondylitis - Plan: Ambulatory referral to Hand Surgery Based on his exam it does appear that he does have a persistent right lateral epicondylitis.  Some of his intermittent discomfort medially may be due to activity modification but I did not appreciate a significant area of discomfort at the medial epicondyle at this time.  Various treatments as above, will refer to hand specialist to consider injection although we discussed possible risks of injection and long-term symptoms.  Likely will need physical therapy, eccentric strengthening.  Restart offloading tennis elbow brace with appropriate location and application discussed.  Handout given.  No orders of the defined types were placed in this encounter.  Patient Instructions  Based on your exam I do think this is a tennis elbow that is just not improving.  I will refer you to a hand specialist to consider either injection or other treatment options.  They may also recommend physical therapy for eccentric strengthening.  May be worth trying a counterforce brace again placed just beyond the area of soreness along the muscle we discussed.  Let me know if there are questions and hang in there.  Tennis Elbow: What to Know  Tennis elbow, also called lateral epicondylitis, is the inflammation of the tendons in your outer forearm, near  your elbow. Tendons are tissues that connect muscle to bone. When you have tennis elbow, the tendons you use to bend your wrist and hand backward get inflamed. Tennis elbow often affects people who play tennis, but it can happen to anyone who moves their wrist, arm, or hands in the same way over and over. What are the causes? Tennis elbow is often caused by: Repeatedly extending the wrist, turning the forearm, or using the hands. Playing sports or doing tasks that involve repetitive forearm movements. Sudden injury. What increases the risk? You're more likely to get tennis elbow if you play tennis or another racket sport. But, it can affect anyone who uses their hands a lot. Other people at greater risk include: People who use computers. Holiday Representative workers. Factory workers. Musicians. Cooks. Cashiers. What are the signs or symptoms? Pain and tenderness along the outside of your forearm and elbow. It might hurt all of the time or only when you use your arm. A burning feeling that starts in the elbow and spreads down the arm. A weak grip. How is this diagnosed? This condition is diagnosed based on your symptoms, medical history, and a physical exam. You may also have X-rays or an MRI to: Confirm the diagnosis. Look for other issues. Check for tears in the ligaments, muscles, or tendons. How is this treated? Treatment may include: Resting and icing the arm. Taking NSAIDs, such as ibuprofen. Getting steroid shots to lessen pain and swelling. Doing physical therapy exercises to improve movement and strength in your arm. Wearing an elbow brace or wrist strap to limit movements that cause symptoms. Surgery, if other treatments don't work. This is rare. Follow these instructions at home: If you have a brace or strap: Wear the brace or strap as told. Take it off only if your health care provider says you can. Check the skin around it every day. Tell your provider if you see  problems. Loosen the brace or strap if your fingers tingle, become numb, or turn cold and blue. Keep the brace clean. If the brace or  strap isn't waterproof: Do not let it get wet. Cover it when you take a bath or a shower. Use a cover that doesn't let any water in. Managing pain, stiffness, and swelling  Use ice or an ice pack as told. If you have a brace or strap that you can take off, remove it only as told. Put ice in a plastic bag. Place a towel between your skin and the ice. Leave the ice on for 20 minutes, 2-3 times a day. If your skin turns red, take off the ice right away to prevent skin damage. The risk of damage is higher if you can't feel pain, heat, or cold. Move your fingers often to reduce stiffness and swelling. Raise your arm above the level of your heart while you're sitting or lying down. Use pillows as needed. Activity Rest your elbow and wrist. Avoid the activity that causes your symptoms. Do physical therapy exercises as told by your provider. Lift objects with the palm of your hand facing up. This lessens stress on your elbow. Lifestyle If your tennis elbow is caused by sports, check your sports equipment. Make sure you're using it right and it fits well. If your tennis elbow is caused by work or computer use, take breaks often to stretch your arm. Talk with your employer about how to make your condition better at work. General instructions Take your medicines only as told. Do not smoke, vape, or use nicotine or tobacco. Doing this can slow down healing. How is this prevented? Warm up and stretch before being active. Cool down and stretch after being active. Give your body time to rest. Use equipment that fits you. If you play tennis, put power in your stroke with your lower body. Avoid using your arm only. Stay fit. Keep your body strong and flexible. Do exercises to strengthen the forearm muscles. Contact a health care provider if: You have pain that gets  worse. Your pain doesn't get better with treatment. You have numbness or weakness in your forearm, hand, or fingers. Get help right away if: Your pain is very bad. You can't move your wrist. This information is not intended to replace advice given to you by your health care provider. Make sure you discuss any questions you have with your health care provider. Document Revised: 09/21/2022 Document Reviewed: 09/21/2022 Elsevier Patient Education  2024 Elsevier Inc.    Signed,   Reyes Pines, MD [facility], [facility] 03/15/24 10:20 AM      [1]  Allergies Allergen Reactions   Bee Pollen     Allergic rhinoconjunctivitis   Pollen Extract     Allergic rhinoconjunctivitis   Crestor  [Rosuvastatin ]     Myalgias, Fatigue   Penicillins      ? Reaction as child   "

## 2024-03-15 NOTE — Patient Instructions (Signed)
 Based on your exam I do think this is a tennis elbow that is just not improving.  I will refer you to a hand specialist to consider either injection or other treatment options.  They may also recommend physical therapy for eccentric strengthening.  May be worth trying a counterforce brace again placed just beyond the area of soreness along the muscle we discussed.  Let me know if there are questions and hang in there.  Tennis Elbow: What to Know  Tennis elbow, also called lateral epicondylitis, is the inflammation of the tendons in your outer forearm, near your elbow. Tendons are tissues that connect muscle to bone. When you have tennis elbow, the tendons you use to bend your wrist and hand backward get inflamed. Tennis elbow often affects people who play tennis, but it can happen to anyone who moves their wrist, arm, or hands in the same way over and over. What are the causes? Tennis elbow is often caused by: Repeatedly extending the wrist, turning the forearm, or using the hands. Playing sports or doing tasks that involve repetitive forearm movements. Sudden injury. What increases the risk? You're more likely to get tennis elbow if you play tennis or another racket sport. But, it can affect anyone who uses their hands a lot. Other people at greater risk include: People who use computers. [employer] workers. Factory workers. Musicians. Cooks. Cashiers. What are the signs or symptoms? Pain and tenderness along the outside of your forearm and elbow. It might hurt all of the time or only when you use your arm. A burning feeling that starts in the elbow and spreads down the arm. A weak grip. How is this diagnosed? This condition is diagnosed based on your symptoms, medical history, and a physical exam. You may also have X-rays or an MRI to: Confirm the diagnosis. Look for other issues. Check for tears in the ligaments, muscles, or tendons. How is this treated? Treatment may  include: Resting and icing the arm. Taking NSAIDs, such as ibuprofen. Getting steroid shots to lessen pain and swelling. Doing physical therapy exercises to improve movement and strength in your arm. Wearing an elbow brace or wrist strap to limit movements that cause symptoms. Surgery, if other treatments don't work. This is rare. Follow these instructions at home: If you have a brace or strap: Wear the brace or strap as told. Take it off only if your health care provider says you can. Check the skin around it every day. Tell your provider if you see problems. Loosen the brace or strap if your fingers tingle, become numb, or turn cold and blue. Keep the brace clean. If the brace or strap isn't waterproof: Do not let it get wet. Cover it when you take a bath or a shower. Use a cover that doesn't let any water in. Managing pain, stiffness, and swelling  Use ice or an ice pack as told. If you have a brace or strap that you can take off, remove it only as told. Put ice in a plastic bag. Place a towel between your skin and the ice. Leave the ice on for 20 minutes, 2-3 times a day. If your skin turns red, take off the ice right away to prevent skin damage. The risk of damage is higher if you can't feel pain, heat, or cold. Move your fingers often to reduce stiffness and swelling. Raise your arm above the level of your heart while you're sitting or lying down. Use pillows as needed. Activity Rest  your elbow and wrist. Avoid the activity that causes your symptoms. Do physical therapy exercises as told by your provider. Lift objects with the palm of your hand facing up. This lessens stress on your elbow. Lifestyle If your tennis elbow is caused by sports, check your sports equipment. Make sure you're using it right and it fits well. If your tennis elbow is caused by work or computer use, take breaks often to stretch your arm. Talk with your employer about how to make your condition better at  work. General instructions Take your medicines only as told. Do not smoke, vape, or use nicotine or tobacco. Doing this can slow down healing. How is this prevented? Warm up and stretch before being active. Cool down and stretch after being active. Give your body time to rest. Use equipment that fits you. If you play tennis, put power in your stroke with your lower body. Avoid using your arm only. Stay fit. Keep your body strong and flexible. Do exercises to strengthen the forearm muscles. Contact a health care provider if: You have pain that gets worse. Your pain doesn't get better with treatment. You have numbness or weakness in your forearm, hand, or fingers. Get help right away if: Your pain is very bad. You can't move your wrist. This information is not intended to replace advice given to you by your health care provider. Make sure you discuss any questions you have with your health care provider. Document Revised: 09/21/2022 Document Reviewed: 09/21/2022 [employer] Patient Education  2024 [employer].

## 2024-10-18 ENCOUNTER — Encounter: Admitting: Family Medicine
